# Patient Record
Sex: Female | Born: 1970 | Race: White | Hispanic: No | Marital: Married | Smoking: Former smoker
Health system: Southern US, Community
[De-identification: ages and names within clinical notes are randomized; demographics above are authoritative.]

## PROBLEM LIST (undated history)

## (undated) DIAGNOSIS — F329 Major depressive disorder, single episode, unspecified: Secondary | ICD-10-CM

## (undated) DIAGNOSIS — N63 Unspecified lump in unspecified breast: Secondary | ICD-10-CM

## (undated) DIAGNOSIS — E349 Endocrine disorder, unspecified: Secondary | ICD-10-CM

## (undated) DIAGNOSIS — R51 Headache: Secondary | ICD-10-CM

## (undated) DIAGNOSIS — Z87891 Personal history of nicotine dependence: Secondary | ICD-10-CM

## (undated) DIAGNOSIS — Z1239 Encounter for other screening for malignant neoplasm of breast: Secondary | ICD-10-CM

## (undated) DIAGNOSIS — Z1211 Encounter for screening for malignant neoplasm of colon: Secondary | ICD-10-CM

## (undated) DIAGNOSIS — F32A Depression, unspecified: Secondary | ICD-10-CM

## (undated) DIAGNOSIS — M549 Dorsalgia, unspecified: Secondary | ICD-10-CM

## (undated) DIAGNOSIS — E0590 Thyrotoxicosis, unspecified without thyrotoxic crisis or storm: Secondary | ICD-10-CM

## (undated) DIAGNOSIS — K5790 Diverticulosis of intestine, part unspecified, without perforation or abscess without bleeding: Secondary | ICD-10-CM

## (undated) DIAGNOSIS — M810 Age-related osteoporosis without current pathological fracture: Secondary | ICD-10-CM

## (undated) DIAGNOSIS — C801 Malignant (primary) neoplasm, unspecified: Secondary | ICD-10-CM

## (undated) DIAGNOSIS — E0500 Thyrotoxicosis with diffuse goiter without thyrotoxic crisis or storm: Secondary | ICD-10-CM

## (undated) DIAGNOSIS — J302 Other seasonal allergic rhinitis: Secondary | ICD-10-CM

## (undated) DIAGNOSIS — F419 Anxiety disorder, unspecified: Secondary | ICD-10-CM

## (undated) DIAGNOSIS — E785 Hyperlipidemia, unspecified: Secondary | ICD-10-CM

## (undated) DIAGNOSIS — D249 Benign neoplasm of unspecified breast: Secondary | ICD-10-CM

## (undated) DIAGNOSIS — M797 Fibromyalgia: Secondary | ICD-10-CM

## (undated) DIAGNOSIS — G8929 Other chronic pain: Secondary | ICD-10-CM

## (undated) HISTORY — DX: Encounter for other screening for malignant neoplasm of breast: Z12.39

## (undated) HISTORY — DX: Endocrine disorder, unspecified: E34.9

## (undated) HISTORY — DX: Other chronic pain: G89.29

## (undated) HISTORY — DX: Benign neoplasm of unspecified breast: D24.9

## (undated) HISTORY — DX: Personal history of nicotine dependence: Z87.891

## (undated) HISTORY — DX: Hyperlipidemia, unspecified: E78.5

## (undated) HISTORY — DX: Thyrotoxicosis with diffuse goiter without thyrotoxic crisis or storm: E05.00

## (undated) HISTORY — DX: Other seasonal allergic rhinitis: J30.2

## (undated) HISTORY — DX: Unspecified lump in unspecified breast: N63.0

## (undated) HISTORY — DX: Fibromyalgia: M79.7

## (undated) HISTORY — DX: Malignant (primary) neoplasm, unspecified: C80.1

## (undated) HISTORY — DX: Depression, unspecified: F32.A

## (undated) HISTORY — DX: Headache: R51

## (undated) HISTORY — DX: Thyrotoxicosis, unspecified without thyrotoxic crisis or storm: E05.90

## (undated) HISTORY — DX: Dorsalgia, unspecified: M54.9

## (undated) HISTORY — PX: COLONOSCOPY: SHX174

## (undated) HISTORY — DX: Age-related osteoporosis without current pathological fracture: M81.0

## (undated) HISTORY — DX: Diverticulosis of intestine, part unspecified, without perforation or abscess without bleeding: K57.90

## (undated) HISTORY — DX: Major depressive disorder, single episode, unspecified: F32.9

## (undated) HISTORY — PX: ABDOMINAL HYSTERECTOMY: SHX81

## (undated) HISTORY — DX: Anxiety disorder, unspecified: F41.9

## (undated) HISTORY — DX: Encounter for screening for malignant neoplasm of colon: Z12.11

---

## 1993-05-03 HISTORY — PX: TONSILLECTOMY: SUR1361

## 2006-05-03 HISTORY — PX: THYROID SURGERY: SHX805

## 2008-05-03 HISTORY — PX: TUBAL LIGATION: SHX77

## 2008-10-31 HISTORY — PX: ENDOMETRIAL ABLATION: SHX621

## 2009-02-26 ENCOUNTER — Ambulatory Visit: Payer: Self-pay | Admitting: Unknown Physician Specialty

## 2009-05-03 HISTORY — PX: BREAST BIOPSY: SHX20

## 2010-02-18 ENCOUNTER — Encounter: Payer: Self-pay | Admitting: Neurosurgery

## 2010-03-03 ENCOUNTER — Encounter: Payer: Self-pay | Admitting: Neurosurgery

## 2010-04-02 ENCOUNTER — Encounter: Payer: Self-pay | Admitting: Neurosurgery

## 2010-12-22 ENCOUNTER — Other Ambulatory Visit: Payer: Self-pay | Admitting: Gastroenterology

## 2011-01-05 ENCOUNTER — Emergency Department (HOSPITAL_COMMUNITY): Payer: Medicaid Other

## 2011-01-05 ENCOUNTER — Emergency Department (HOSPITAL_COMMUNITY)
Admission: EM | Admit: 2011-01-05 | Discharge: 2011-01-06 | Disposition: A | Discharge status: Home / Self Care | Payer: Medicaid Other | Attending: Emergency Medicine | Admitting: Emergency Medicine

## 2011-01-05 DIAGNOSIS — R059 Cough, unspecified: Secondary | ICD-10-CM | POA: Insufficient documentation

## 2011-01-05 DIAGNOSIS — R05 Cough: Secondary | ICD-10-CM | POA: Insufficient documentation

## 2011-01-05 DIAGNOSIS — R0602 Shortness of breath: Secondary | ICD-10-CM | POA: Insufficient documentation

## 2011-01-05 DIAGNOSIS — R0989 Other specified symptoms and signs involving the circulatory and respiratory systems: Secondary | ICD-10-CM | POA: Insufficient documentation

## 2011-01-05 DIAGNOSIS — R42 Dizziness and giddiness: Secondary | ICD-10-CM | POA: Insufficient documentation

## 2011-01-05 DIAGNOSIS — Z79899 Other long term (current) drug therapy: Secondary | ICD-10-CM | POA: Insufficient documentation

## 2011-01-05 DIAGNOSIS — R079 Chest pain, unspecified: Secondary | ICD-10-CM | POA: Insufficient documentation

## 2011-01-05 DIAGNOSIS — R109 Unspecified abdominal pain: Secondary | ICD-10-CM | POA: Insufficient documentation

## 2011-01-05 DIAGNOSIS — R0609 Other forms of dyspnea: Secondary | ICD-10-CM | POA: Insufficient documentation

## 2011-01-05 DIAGNOSIS — E039 Hypothyroidism, unspecified: Secondary | ICD-10-CM | POA: Insufficient documentation

## 2011-01-05 LAB — COMPREHENSIVE METABOLIC PANEL
AST: 18 U/L (ref 0–37)
Albumin: 4 g/dL (ref 3.5–5.2)
Alkaline Phosphatase: 60 U/L (ref 39–117)
BUN: 11 mg/dL (ref 6–23)
Potassium: 3.3 mEq/L — ABNORMAL LOW (ref 3.5–5.1)
Sodium: 138 mEq/L (ref 135–145)
Total Protein: 6.7 g/dL (ref 6.0–8.3)

## 2011-01-05 LAB — POCT I-STAT TROPONIN I
Troponin i, poc: 0 ng/mL (ref 0.00–0.08)
Troponin i, poc: 0.02 ng/mL (ref 0.00–0.08)

## 2011-01-05 LAB — DIFFERENTIAL
Basophils Absolute: 0 10*3/uL (ref 0.0–0.1)
Basophils Relative: 0 % (ref 0–1)
Eosinophils Absolute: 0.2 10*3/uL (ref 0.0–0.7)
Eosinophils Relative: 2 % (ref 0–5)
Monocytes Absolute: 0.8 10*3/uL (ref 0.1–1.0)

## 2011-01-05 LAB — CK TOTAL AND CKMB (NOT AT ARMC): Total CK: 50 U/L (ref 7–177)

## 2011-01-05 LAB — D-DIMER, QUANTITATIVE: D-Dimer, Quant: 0.24 ug/mL-FEU (ref 0.00–0.48)

## 2011-01-05 LAB — CBC
HCT: 38.8 % (ref 36.0–46.0)
MCHC: 34.8 g/dL (ref 30.0–36.0)
Platelets: 231 10*3/uL (ref 150–400)
RDW: 11.9 % (ref 11.5–15.5)
WBC: 9.9 10*3/uL (ref 4.0–10.5)

## 2011-01-14 ENCOUNTER — Ambulatory Visit: Payer: Self-pay | Admitting: Gastroenterology

## 2011-01-14 HISTORY — PX: ESOPHAGOGASTRODUODENOSCOPY: SHX1529

## 2011-03-08 ENCOUNTER — Other Ambulatory Visit (INDEPENDENT_AMBULATORY_CARE_PROVIDER_SITE_OTHER): Payer: Medicaid Other

## 2011-03-08 ENCOUNTER — Encounter: Payer: Self-pay | Admitting: Endocrinology

## 2011-03-08 ENCOUNTER — Ambulatory Visit (INDEPENDENT_AMBULATORY_CARE_PROVIDER_SITE_OTHER): Payer: Medicaid Other | Admitting: Endocrinology

## 2011-03-08 VITALS — BP 118/70 | HR 70 | Temp 98.3°F | Ht 67.0 in | Wt 162.0 lb

## 2011-03-08 DIAGNOSIS — E0590 Thyrotoxicosis, unspecified without thyrotoxic crisis or storm: Secondary | ICD-10-CM | POA: Insufficient documentation

## 2011-03-08 LAB — TSH: TSH: 3.07 u[IU]/mL (ref 0.35–5.50)

## 2011-03-08 NOTE — Progress Notes (Signed)
  Subjective:    Patient ID: Stephanie Good, female    DOB: January 20, 1971, 40 y.o.   MRN: [id number]  HPI Pt says she was Good dx'ed with hyperthyroidism in approx 2009.  She was rx'ed with tapazole x a few mos.  She stopped due to normalization of her tft.  She now reports few mos of severe tremor of the hands, and assoc hair loss.  6 weeks ago, tapazole was increased, and subsequent blood test showed normalization.  She has had tubal ligation.   No past medical history on file.  No past surgical history on file.  History   Social History  . Marital Status: Married    Spouse Name: N/A    Number of Children: N/A  . Years of Education: N/A   Occupational History  . Not on file.   Social History Main Topics  . Smoking status: Former Games developer  . Smokeless tobacco: Not on file  . Alcohol Use: Not on file  . Drug Use: Not on file  . Sexually Active: Not on file   Other Topics Concern  . Not on file   Social History Narrative  . No narrative on file    No current outpatient prescriptions on file prior to visit.    No Known Allergies  No family history on file. adopted BP 118/70  Pulse 70  Temp(Src) 98.3 F (36.8 C) (Oral)  Ht 5\' 7"  (1.702 m)  Wt 162 lb (73.483 kg)  BMI 25.37 kg/m2  SpO2 97%  Review of Systems denies diplopia, palpitations, sob, diarrhea, polyuria, seizure, and hypoglycemia.  She has lost 15 lbs x a few mos, and has since regained some of that.  She reports headache, itching, easy bruising, depression, rhinorrhea, anxiety, numbness of the left arm, excessive diaphoresis, hoarseness, and myalgias.    Objective:   Physical Exam VS: see vs page GEN: no distress HEAD: head: no deformity eyes: no periorbital swelling, no proptosis external nose and ears are normal mouth: no lesion seen NECK: supple, thyroid is borderline enlarged, diffuse.   CHEST WALL: no deformity LUNGS:  Clear to auscultation CV: reg rate and rhythm, no murmur ABD: abdomen is soft,  nontender.  no hepatosplenomegaly.  not distended.  no hernia.   MUSCULOSKELETAL: muscle bulk and strength are grossly normal.  no obvious joint swelling.  gait is normal and steady EXTEMITIES: no deformity.  no ulcer on the feet.  feet are of normal color and temp.  no edema PULSES: dorsalis pedis intact bilat.   NEURO:  cn 2-12 grossly intact.   readily moves all 4's.  sensation is intact to touch on the feet.  No tremor. SKIN:  Normal texture and temperature.  Not diaphoretic.  No rash or suspicious lesion is visible.   NODES:  None palpable at the neck. PSYCH: alert, oriented x3.  Does not appear anxious nor depressed.    outside test results are reviewed: TSH and free t4's are reviewed  Today: Lab Results  Component Value Date   TSH 3.07 03/08/2011      Assessment & Plan:  Hyperthyroidism, recurrent off tapazole, now well-controlled on back on it. Anxiety, not due to hyperthyroidism. Weight loss, may have been due to her recent hyperthyroidism.

## 2011-03-08 NOTE — Patient Instructions (Addendum)
if ever you have fever while taking methimazole, stop it and call us, because of the risk of a rare side-effect blood tests are being requested for you today.  please call [phone] to hear your test results.  You will be prompted to enter the 9-digit "MRN" number that appears at the top left of this page, followed by #.  Then you will hear the message. Please come back for a follow-up appointment for 1 month.  Please make an appointment. (update: i left message on phone-tree:  Reduce tapazole to 10-bid)

## 2011-03-10 ENCOUNTER — Encounter: Payer: Self-pay | Admitting: Endocrinology

## 2011-03-10 DIAGNOSIS — F32A Depression, unspecified: Secondary | ICD-10-CM | POA: Insufficient documentation

## 2011-03-10 DIAGNOSIS — F419 Anxiety disorder, unspecified: Secondary | ICD-10-CM | POA: Insufficient documentation

## 2011-03-10 DIAGNOSIS — F329 Major depressive disorder, single episode, unspecified: Secondary | ICD-10-CM | POA: Insufficient documentation

## 2011-03-10 DIAGNOSIS — E785 Hyperlipidemia, unspecified: Secondary | ICD-10-CM | POA: Insufficient documentation

## 2011-03-15 ENCOUNTER — Telehealth: Payer: Self-pay | Admitting: *Deleted

## 2011-03-15 NOTE — Telephone Encounter (Signed)
Pt informed of MD's advisement. 

## 2011-03-15 NOTE — Telephone Encounter (Signed)
Pt states that she was seen last week at [facility] and adjustment in medication was made. She states that she felt better a couple of days after taking medication at new dosage but now she feels worse and is still having flu-like sxs and fatigue. Pt is asking for MD's advisement.

## 2011-03-15 NOTE — Telephone Encounter (Signed)
As thyroid was normal, please see pcp about sxs

## 2011-04-05 ENCOUNTER — Encounter: Payer: Self-pay | Admitting: Endocrinology

## 2011-04-05 ENCOUNTER — Ambulatory Visit (INDEPENDENT_AMBULATORY_CARE_PROVIDER_SITE_OTHER): Payer: Medicaid Other | Admitting: Endocrinology

## 2011-04-05 ENCOUNTER — Other Ambulatory Visit (INDEPENDENT_AMBULATORY_CARE_PROVIDER_SITE_OTHER): Payer: Medicaid Other

## 2011-04-05 DIAGNOSIS — Z79899 Other long term (current) drug therapy: Secondary | ICD-10-CM | POA: Insufficient documentation

## 2011-04-05 DIAGNOSIS — E0590 Thyrotoxicosis, unspecified without thyrotoxic crisis or storm: Secondary | ICD-10-CM

## 2011-04-05 LAB — CBC WITH DIFFERENTIAL/PLATELET
Basophils Relative: 0.5 % (ref 0.0–3.0)
Eosinophils Relative: 1.4 % (ref 0.0–5.0)
HCT: 43.2 % (ref 36.0–46.0)
Lymphs Abs: 2.4 10*3/uL (ref 0.7–4.0)
MCV: 88.3 fl (ref 78.0–100.0)
Monocytes Absolute: 1 10*3/uL (ref 0.1–1.0)
Platelets: 223 10*3/uL (ref 150.0–400.0)
RBC: 4.89 Mil/uL (ref 3.87–5.11)
WBC: 10.2 10*3/uL (ref 4.5–10.5)

## 2011-04-05 NOTE — Progress Notes (Signed)
  Subjective:    Patient ID: Stephanie Good, female    DOB: [birth year], 40 y.o.   MRN: [id number]  HPI Pt returns for f/u of hyperthyroidism.  She says she saw pcp 1 week after ov here.  She says based on the result, tapazole was reduced to 10 mg daily.  She feels as though she has fever, but it is not detected on thermometer.  She reports sxs of nasal congestion, fatigue, and myalgias. Past Medical History  Diagnosis Date  . Headache   . Graves disease   . Hyperthyroidism   . Breast nodule     Fibrocystic nodule in the right breast  . Hemorrhoids   . Chronic back pain   . Diverticulosis   . Anxiety   . Depression   . Hyperlipidemia     Past Surgical History  Procedure Date  . Endometrial ablation July 2010  . Tonsillectomy 1995  . Tubal ligation 2010  . Breast biopsy 2011  . Esophagogastroduodenoscopy 01/14/2011    History   Social History  . Marital Status: Married    Spouse Name: N/A    Number of Children: N/A  . Years of Education: 132   Occupational History  . Hair Stylist    Social History Main Topics  . Smoking status: Former Games developer  . Smokeless tobacco: Not on file  . Alcohol Use: Yes  . Drug Use: No  . Sexually Active: Not on file   Other Topics Concern  . Not on file   Social History Narrative   Regular exercise-no    Current Outpatient Prescriptions on File Prior to Visit  Medication Sig Dispense Refill  . clonazePAM (KLONOPIN) 1 MG tablet Take 0.5 mg by mouth 2 (two) times daily. Or as needed for panic attacks       . methimazole (TAPAZOLE) 10 MG tablet Take 10 mg by mouth daily.         No Known Allergies  Family History  Problem Relation Age of Onset  . Adopted: Yes    BP 124/68  Pulse 72  Temp(Src) 97.9 F (36.6 C) (Oral)  Ht 5\' 6"  (1.676 m)  Wt 159 lb 12.8 oz (72.485 kg)  BMI 25.79 kg/m2  SpO2 97%  LMP 03/04/2011  Review of Systems Denies n/v.      Objective:   Physical Exam VITAL SIGNS:  See vs page GENERAL: no  distress Neck: thyroid is minimally and diffusely enlarged.  No nodule   Lab Results  Component Value Date   TSH 1.30 04/05/2011      Assessment & Plan:  Hyperthyroidism, well-controlled sxs of non-thyroidal etiology

## 2011-04-05 NOTE — Patient Instructions (Addendum)
if ever you have fever while taking methimazole, stop it and call us, because of the risk of a rare side-effect blood tests are being requested for you today.  please call [phone] to hear your test results.  You will be prompted to enter the 9-digit "MRN" number that appears at the top left of this page, followed by #.  Then you will hear the message. Please come back for a follow-up appointment for 6 weeks.  Please make an appointment. Please let me know if you want to pursue the radioactive iodine treatment.

## 2011-05-04 DIAGNOSIS — M549 Dorsalgia, unspecified: Secondary | ICD-10-CM

## 2011-05-04 DIAGNOSIS — M797 Fibromyalgia: Secondary | ICD-10-CM

## 2011-05-04 DIAGNOSIS — M810 Age-related osteoporosis without current pathological fracture: Secondary | ICD-10-CM

## 2011-05-04 HISTORY — DX: Age-related osteoporosis without current pathological fracture: M81.0

## 2011-05-04 HISTORY — DX: Dorsalgia, unspecified: M54.9

## 2011-05-04 HISTORY — DX: Fibromyalgia: M79.7

## 2011-05-17 ENCOUNTER — Ambulatory Visit: Payer: Medicaid Other | Admitting: Endocrinology

## 2011-08-23 DIAGNOSIS — E0500 Thyrotoxicosis with diffuse goiter without thyrotoxic crisis or storm: Secondary | ICD-10-CM | POA: Insufficient documentation

## 2011-08-27 DIAGNOSIS — E039 Hypothyroidism, unspecified: Secondary | ICD-10-CM | POA: Insufficient documentation

## 2011-10-11 DIAGNOSIS — R5381 Other malaise: Secondary | ICD-10-CM | POA: Insufficient documentation

## 2011-10-11 DIAGNOSIS — R29898 Other symptoms and signs involving the musculoskeletal system: Secondary | ICD-10-CM | POA: Insufficient documentation

## 2011-10-11 DIAGNOSIS — R5383 Other fatigue: Secondary | ICD-10-CM | POA: Insufficient documentation

## 2011-10-11 DIAGNOSIS — E559 Vitamin D deficiency, unspecified: Secondary | ICD-10-CM | POA: Insufficient documentation

## 2011-10-11 DIAGNOSIS — M5380 Other specified dorsopathies, site unspecified: Secondary | ICD-10-CM | POA: Insufficient documentation

## 2011-12-08 DIAGNOSIS — M5412 Radiculopathy, cervical region: Secondary | ICD-10-CM | POA: Insufficient documentation

## 2012-02-16 DIAGNOSIS — M6240 Contracture of muscle, unspecified site: Secondary | ICD-10-CM | POA: Insufficient documentation

## 2012-04-25 DIAGNOSIS — M797 Fibromyalgia: Secondary | ICD-10-CM | POA: Insufficient documentation

## 2012-04-25 DIAGNOSIS — M539 Dorsopathy, unspecified: Secondary | ICD-10-CM | POA: Insufficient documentation

## 2012-05-03 HISTORY — PX: BREAST LUMPECTOMY: SHX2

## 2012-06-24 ENCOUNTER — Encounter: Payer: Self-pay | Admitting: General Surgery

## 2012-06-26 ENCOUNTER — Encounter: Payer: Self-pay | Admitting: Endocrinology

## 2012-07-28 ENCOUNTER — Emergency Department: Payer: Self-pay | Admitting: Emergency Medicine

## 2012-07-28 LAB — URINALYSIS, COMPLETE
Bacteria: NONE SEEN
Bilirubin,UR: NEGATIVE
Ketone: NEGATIVE
Leukocyte Esterase: NEGATIVE
Nitrite: NEGATIVE
Ph: 5 (ref 4.5–8.0)
Protein: NEGATIVE
RBC,UR: 1 /HPF (ref 0–5)
WBC UR: 1 /HPF (ref 0–5)

## 2012-07-28 LAB — CBC
HCT: 37 % (ref 35.0–47.0)
HGB: 12.8 g/dL (ref 12.0–16.0)
MCHC: 34.7 g/dL (ref 32.0–36.0)
Platelet: 178 10*3/uL (ref 150–440)

## 2012-07-28 LAB — COMPREHENSIVE METABOLIC PANEL
Alkaline Phosphatase: 69 U/L (ref 50–136)
BUN: 14 mg/dL (ref 7–18)
Chloride: 107 mmol/L (ref 98–107)
EGFR (African American): >60
EGFR (Non-African Amer.): >60
Glucose: 101 mg/dL — ABNORMAL HIGH (ref 65–99)
Osmolality: 274 (ref 275–301)
Potassium: 3.6 mmol/L (ref 3.5–5.1)
SGPT (ALT): 36 U/L (ref 12–78)
Sodium: 137 mmol/L (ref 136–145)

## 2012-08-17 ENCOUNTER — Encounter: Payer: Self-pay | Admitting: General Surgery

## 2012-08-18 NOTE — Progress Notes (Signed)
Quick Note:  Make sure the additional views are done prior to her office visit ______ 

## 2012-08-21 ENCOUNTER — Encounter: Payer: Self-pay | Admitting: *Deleted

## 2012-08-21 ENCOUNTER — Encounter: Payer: Self-pay | Admitting: General Surgery

## 2012-08-25 NOTE — Progress Notes (Signed)
Patient had additional views completed at [facility] on 08-18-12.

## 2012-08-28 ENCOUNTER — Encounter: Payer: Self-pay | Admitting: General Surgery

## 2012-08-28 ENCOUNTER — Ambulatory Visit (INDEPENDENT_AMBULATORY_CARE_PROVIDER_SITE_OTHER): Payer: BC Managed Care – PPO | Admitting: General Surgery

## 2012-08-28 VITALS — BP 110/80 | HR 68 | Resp 12 | Ht 67.0 in | Wt 167.0 lb

## 2012-08-28 DIAGNOSIS — R920 Mammographic microcalcification found on diagnostic imaging of breast: Secondary | ICD-10-CM

## 2012-08-28 NOTE — Progress Notes (Signed)
Patient ID: Stephanie Good, female   DOB: 06-Nov-1970, 42 y.o.   MRN: [id number]  Chief Complaint  Patient presents with  . Follow-up    mammogram    HPI Stephanie Good is a 42 y.o. female who presents for a follow up mammogram. The most recent mammogram was done on 08/18/12 with a birad category 4. The patient states the right breast occasionally has nipple discharge. She states it's not a lot but that she seems to notice it when she gets out of the shower. She also complains of right axillary pain. She notices that when she drinks caffeine the tenderness is worse. She has discussed this issue in the past. No other breast problems at this time. She states she does not check her breast regularly but does get regular mammograms.  HPI  Past Medical History  Diagnosis Date  . Headache   . Graves disease   . Hyperthyroidism   . Breast nodule     Fibrocystic nodule in the right breast  . Hemorrhoids   . Chronic back pain   . Diverticulosis   . Anxiety   . Hyperlipidemia   . Seasonal allergies     eye itching and swelling  . Depression   . Benign neoplasm of breast   . Personal history of tobacco use, presenting hazards to health   . Endocrine problem     thyroid goiter  . Breast screening, unspecified   . Lump or mass in breast   . Special screening for malignant neoplasms, colon   . Back pain 2013  . Fibromyalgia 2013  . Osteoporosis 2013    Past Surgical History  Procedure Laterality Date  . Endometrial ablation  July 2010  . Tubal ligation  2010  . Breast biopsy  2011  . Esophagogastroduodenoscopy  01/14/2011  . Colonoscopy  1995 and 2012  . Thyroid surgery  2008  . Tonsillectomy  1995    Family History  Problem Relation Age of Onset  . Adopted: Yes    Social History History  Substance Use Topics  . Smoking status: Former Smoker -- 0.50 packs/day for 20 years    Types: Cigarettes  . Smokeless tobacco: Not on file  . Alcohol Use: 1.5 - 2.5 oz/week    Allergies   Allergen Reactions  . Pollen Extract Itching    Eye swelling    Current Outpatient Prescriptions  Medication Sig Dispense Refill  . clonazePAM (KLONOPIN) 1 MG tablet Take 0.5 mg by mouth 2 (two) times daily. Or as needed for panic attacks       . DULoxetine (CYMBALTA) 60 MG capsule Take 60 mg by mouth daily.      Marland Kitchen HYDROcodone-acetaminophen (NORCO/VICODIN) 5-325 MG per tablet Take 1 tablet by mouth at bedtime as needed for pain.      Marland Kitchen levothyroxine (SYNTHROID) 25 MCG tablet Take 25 mcg by mouth daily.      . methocarbamol (ROBAXIN) 500 MG tablet Take 500 mg by mouth 4 (four) times daily.      . traMADol (ULTRAM) 50 MG tablet Take 50 mg by mouth daily.       No current facility-administered medications for this visit.    Review of Systems Review of Systems  Constitutional: Negative.   Respiratory: Negative.   Cardiovascular: Negative.     Blood pressure 110/80, pulse 68, resp. rate 12, height 5\' 7"  (1.702 m), weight 167 lb (75.751 kg), last menstrual period 08/18/2012.  Physical Exam Physical Exam  Constitutional: She appears well-developed  and well-nourished.  Eyes: Conjunctivae are normal. No scleral icterus.  Neck: Trachea normal. No mass and no thyromegaly present.  Cardiovascular: Normal rate, regular rhythm, normal heart sounds and normal pulses.   No murmur heard. Pulmonary/Chest: Effort normal and breath sounds normal. Right breast exhibits no inverted nipple, no mass, no nipple discharge, no skin change and no tenderness. Left breast exhibits no inverted nipple, no mass, no nipple discharge, no skin change and no tenderness. Breasts are symmetrical.  Lymphadenopathy:    She has no cervical adenopathy.    She has no axillary adenopathy.    Data Reviewed Mammogram reviewed. Focal cluster of microcalcification of right breast in LOQ.  Assessment    Abnormal Mammogram.     Plan    Stereotactic Biopsy.      Patient to have a right breast stereotactic biopsy  at [facility] for 09-11-12 at 1 pm. She is aware of date, time, and instructions.   Stephanie Good 08/28/2012, 7:32 PM

## 2012-08-28 NOTE — Patient Instructions (Addendum)
Patient advised to have stereotactic breast biopsy done on the right breast.    Breast Biopsy, Stereotactic A stereotactic breast biopsy takes a tissue sample from the breast with a special instrument. This is done when:  The problem (lump, abnormality, mass) can be seen on X-ray, but not felt on physical exam.  Suspicious, small calcium deposits (calcifications) are seen in the breast.  There is a change in shape or appearance of the breast, thickening, or asymmetry on mammogram (breast X-ray).  You have nipple changes (unusual or bloody discharge, crusting, retraction, dimpling).  Your caregiver is making a surgical diagnosis. The biopsy may be done on a special table, with your face down and your breasts placed through openings in the table. Computerized imaging (special form of X-rays) is used. The images are not obtained using regular X-ray film. So, exposure to radiation is reduced. Images are seen through several different angles. The surgeon removes small pieces of the suspicious tissue through a hollow needle. The tissue will be sent to the lab for analysis. The surgeon can look at the pictures right away, rather than wait for an X-ray to be developed. Your caregiver can mark the lesions (abnormal tissue formations) electronically. Then the computer can tell exactly where the problem is, or if it has moved. BENEFITS OF THE PROCEDURE  This is a good way to see if tiny lumps, abnormal looking tissue, or calcium deposits that you cannot feel are cancerous or require further treatment or follow-up.  Needle biopsy is a simple procedure. It may be performed in an outpatient imaging center. This means you have the procedure and go home the same day, without checking into a hospital.  It is less painful than open surgery. The results are as accurate as when a tissue sample is removed surgically.  The procedure is faster, less expensive, less invasive, does not distort the breast, and leaves  little or no scar.  Breast defects, which can make future mammograms hard to read and interpret, do not remain.  Recovery time is brief. Patients can soon resume their normal activities.  Using VAD (vacuum assisted device) may make it possible to remove entire lesions.  A breast biopsy can indicate if you need surgery, other treatment, or combined treatment. LET YOUR CAREGIVER KNOW ABOUT:  Allergies.  Medications taken, including herbs, eye drops, over-the-counter medications, and creams.  Use of steroids (by mouth or creams).  Previous problems with anesthetics or numbing medication.  If you are taking blood thinner medications or aspirin.  Possibility of pregnancy, if this applies.  History of blood clots (thrombophlebitis).  History of bleeding or blood problems.  Previous surgery.  Other health problems. RISKS AND COMPLICATIONS  Infection (germ growing in the wound). This can often be treated with antibiotics.  Bleeding, following surgery. Your surgeon takes every precaution to keep this from happening.  There is some concern that if a cancerous mass is present, cancer cells might be spread by the needle. Whether this actually happens is not known. It does not appear to be a significant risk.  X-ray guided breast biopsy is not infallible (not always correct). The problem may be missed or the extent of the problem may be underestimated. This would mean the biopsy did not manage to remove a piece of the diseased tissue or enough of the diseased tissue.  Lesions present, with calcium deposits scattered throughout the breast, are difficult to target by stereotactic method. Those lesions near the chest wall also are hard to learn  about by this method. If the mammogram shows only a vague change in tissue density, but no definite mass or nodule, the X-ray guided method may not be successful. Occasionally, even after a successful biopsy, the tissue diagnosis remains uncertain. A  surgical biopsy will be needed, if abnormal or precancerous cells are found on core biopsy.  Altering or deforming of the breast.  Unable to find, or missing the lesion.  Rarely, the needle may go through the chest wall into the lung area. TWO BIOPSY INSTRUMENTS MAY BE USED IN THE PROCEDURE The conventional biopsy device (core needle biopsy device) consists of an inner needle with a trough extending from it at one end, and an overlying sheath. It is attached to a spring-loaded mechanism that propels it forward. The trough fills with tissue. The outer sheath instantly moves forward to cut the tissue and keep it in the trough. Each sample is obtained in a fraction of a second. It is necessary to withdraw the needle after each sample is taken to collect the tissue.  A newer type of instrument, the VAD (vacuum assisted device), uses vacuum pressure to pull breast tissue into a needle and remove it. The needle does not need to be withdrawn after each sampling. Another advantage is that biopsies are obtained in an orderly manner, by rotating the device. This helps make sure that the entire area of interest will be sampled. When using the automated core biopsy needle, sampling is more random.  FOR COMFORT DURING THE TEST  Relax as much as possible.  Try to follow instructions, to speed up the test.  Let your caregiver know if you are uncomfortable, anxious, or in pain. PROCEDURE  You are awake during the procedure, and you go home the same day (outpatient). A specially trained radiologist will do this procedure. First, the skin is cleansed. Then, it is injected with a local anesthetic. A small nick is made in the skin, and the tip of the biopsy needle is put into the calculated site of the lesion. A special mammography machine uses ionizing radiation to help guide the radiologist's instrument to the site of the abnormal growth. At this point, stereo images are again obtained, to confirm that the needle  tip is at the problem area. Usually 5 to 10 samples are collected when doing a core biopsy. At least 12 are collected when using the vacuum assisted device (VAD). Then, a final set of images is obtained. If they show that the lesion has been mostly or completely removed, a small clip is left at the biopsy site. This is so that it can be easily located, in case the lesion turns out to be cancer. Afterward, the skin opening is stitched (sutured) or taped closed, and covered with a dressing. Your caregiver may apply a pressure dressing and an ice pack, to prevent bleeding and swelling in the breast.  X-ray guided breast biopsy can take 30 minutes to 1 hour, or more. The X-rays usually have no side effects, and no radiation remains in your body. There is usually little or no pain. Usually no scar is left from the tiny skin incision. Many women find that the major discomfort of the procedure is from lying on their stomach, or staying in 1 position for the length of the procedure. This discomfort may be reduced by carefully placed cushions. You should wear a good support bra to the procedure. You will be asked to remove jewelry, dentures, eye glasses, metal objects, or clothing that  might interfere with the X-ray images. You may want to have someone with you, to take you home after the procedure. AFTER THE PROCEDURE   After surgery, if you are doing well and have no problems, you will be allowed to go home.  You may resume your regular diet, or as directed by your caregiver. HOME CARE INSTRUCTIONS   Follow your caregiver's recommendations for medications, care of the biopsy site, follow-up appointments, and further treatment.  Only take over-the-counter or prescription medicines for pain, discomfort, or fever as directed by your caregiver.  An ice pack applied to the affected area may help with discomfort and keep the swelling down.  Change dressings as directed.  Wear a good support bra for as long as  your caregiver recommends.  Avoid strenuous activity for at least 24 hours, or as advised by your caregiver. Finding out the results of your test Not all test results are available during your visit. If your test results are not back during the visit, make an appointment with your caregiver to find out the results. Do not assume everything is normal if you have not heard from your caregiver or the medical facility. It is important for you to follow up on all of your test results.  SEEK MEDICAL CARE IF:   You develop a rash.  You have problems with your medicines.  You become lightheaded or dizzy. SEEK IMMEDIATE MEDICAL CARE IF:   There is increased bleeding (more than a small spot) from the biopsy site.  You notice redness, swelling, or increasing pain in the wound.  Pus is coming from the wound.  You have a fever.  You notice a bad smell coming from the wound or dressing.  You develop shortness of breath.  You develop chest pain.  You pass out. Document Released: 01/16/2003 Document Revised: 07/12/2011 Document Reviewed: 02/21/2009 [facility] Patient Information 2013 [place], [place].  Patient to have a right breast stereotactic biopsy at [facility] for 09-11-12 at 1 pm. She is aware of date, time, and instructions.

## 2012-08-31 HISTORY — PX: BREAST BIOPSY: SHX20

## 2012-09-11 ENCOUNTER — Ambulatory Visit: Payer: Self-pay | Admitting: General Surgery

## 2012-09-11 DIAGNOSIS — R920 Mammographic microcalcification found on diagnostic imaging of breast: Secondary | ICD-10-CM

## 2012-09-13 ENCOUNTER — Ambulatory Visit (INDEPENDENT_AMBULATORY_CARE_PROVIDER_SITE_OTHER): Payer: BC Managed Care – PPO | Admitting: General Surgery

## 2012-09-13 ENCOUNTER — Encounter: Payer: Self-pay | Admitting: General Surgery

## 2012-09-13 VITALS — BP 100/62 | HR 70 | Resp 14 | Ht 67.0 in | Wt 165.0 lb

## 2012-09-13 DIAGNOSIS — D0511 Intraductal carcinoma in situ of right breast: Secondary | ICD-10-CM

## 2012-09-13 DIAGNOSIS — D0590 Unspecified type of carcinoma in situ of unspecified breast: Secondary | ICD-10-CM

## 2012-09-13 NOTE — Progress Notes (Signed)
Patient ID: Stephanie Good, female   DOB: [birth year], 42 y.o.   MRN: [id number]  Chief Complaint  Patient presents with  . Follow-up    discussion    HPI Stephanie Good is a 42 y.o. female. Patient here today for discussion of breast biopsy results. HPI  Past Medical History  Diagnosis Date  . Headache   . Graves disease   . Hyperthyroidism   . Breast nodule     Fibrocystic nodule in the right breast  . Hemorrhoids   . Chronic back pain   . Diverticulosis   . Anxiety   . Hyperlipidemia   . Seasonal allergies     eye itching and swelling  . Depression   . Benign neoplasm of breast   . Personal history of tobacco use, presenting hazards to health   . Endocrine problem     thyroid goiter  . Breast screening, unspecified   . Lump or mass in breast   . Special screening for malignant neoplasms, colon   . Back pain 2013  . Fibromyalgia 2013  . Osteoporosis 2013    Past Surgical History  Procedure Laterality Date  . Endometrial ablation  July 2010  . Tubal ligation  2010  . Esophagogastroduodenoscopy  01/14/2011  . Colonoscopy  1995 and 2012  . Thyroid surgery  2008  . Tonsillectomy  1995  . Breast biopsy  2011  . Breast biopsy Right May 2014    Family History  Problem Relation Age of Onset  . Adopted: Yes    Social History History  Substance Use Topics  . Smoking status: Former Smoker -- 0.50 packs/day for 20 years    Types: Cigarettes  . Smokeless tobacco: Not on file  . Alcohol Use: 1.5 - 2.5 oz/week    Allergies  Allergen Reactions  . Pollen Extract Itching    Eye swelling    Current Outpatient Prescriptions  Medication Sig Dispense Refill  . clonazePAM (KLONOPIN) 1 MG tablet Take 0.5 mg by mouth 2 (two) times daily. Or as needed for panic attacks       . DULoxetine (CYMBALTA) 60 MG capsule Take 60 mg by mouth daily.      Marland Kitchen HYDROcodone-acetaminophen (NORCO/VICODIN) 5-325 MG per tablet Take 1 tablet by mouth at bedtime as needed for pain.      Marland Kitchen  levothyroxine (SYNTHROID) 25 MCG tablet Take 25 mcg by mouth daily.      . methocarbamol (ROBAXIN) 500 MG tablet Take 500 mg by mouth 4 (four) times daily.      . traMADol (ULTRAM) 50 MG tablet Take 50 mg by mouth daily.       No current facility-administered medications for this visit.    Review of Systems Review of Systems  Constitutional: Negative.   Respiratory: Positive for cough.   Cardiovascular: Negative.     Blood pressure 100/62, pulse 70, resp. rate 14, height 5\' 7"  (1.702 m), weight 165 lb (74.844 kg), last menstrual period 09/12/2012.  Physical Exam Physical Exam Steri strips intact, no bruising noted.   Data Reviewed Pathoilogy from right breast stereobiopsy-low gade DCIS. ER/PR pending  Assessment    DCIS right breast     Plan    Discussed findings in full with pt. Treatment options were reviewed. Pt is amenable to lumpectomy.  Procedure explained in full to pt.     Patient's surgery has been scheduled for 09-22-12 at [facility].    Aamari Strawderman G 09/14/2012, 7:46 PM

## 2012-09-13 NOTE — Patient Instructions (Addendum)
Proceed with lumpectomy right breast. Call office for any new  issues or concerns.  Patient's surgery has been scheduled for 09-22-12 at [facility].

## 2012-09-14 ENCOUNTER — Encounter: Payer: Self-pay | Admitting: General Surgery

## 2012-09-14 ENCOUNTER — Telehealth: Payer: Self-pay | Admitting: *Deleted

## 2012-09-14 ENCOUNTER — Ambulatory Visit: Payer: BC Managed Care – PPO | Admitting: General Surgery

## 2012-09-14 NOTE — Telephone Encounter (Signed)
Patient would like a phone call from Dr Evette Cristal once the additional test results come in because she wants to talk about having a mastectomy.

## 2012-09-15 ENCOUNTER — Other Ambulatory Visit: Payer: Self-pay | Admitting: General Surgery

## 2012-09-15 DIAGNOSIS — D0511 Intraductal carcinoma in situ of right breast: Secondary | ICD-10-CM

## 2012-09-18 ENCOUNTER — Telehealth: Payer: Self-pay | Admitting: *Deleted

## 2012-09-18 NOTE — Telephone Encounter (Signed)
Dr. Evette Cristal has contacted patient and she wishes to cancel surgery for 09-22-12 at this time. She is to come to the office on 09-19-12 to have genetic testing done. Further decision for surgery will be based on results.   Leah in [facility] notified of cancellation.

## 2012-09-19 ENCOUNTER — Ambulatory Visit: Payer: BC Managed Care – PPO

## 2012-09-19 ENCOUNTER — Ambulatory Visit (INDEPENDENT_AMBULATORY_CARE_PROVIDER_SITE_OTHER): Payer: BC Managed Care – PPO | Admitting: *Deleted

## 2012-09-19 DIAGNOSIS — D0590 Unspecified type of carcinoma in situ of unspecified breast: Secondary | ICD-10-CM

## 2012-09-19 DIAGNOSIS — D0511 Intraductal carcinoma in situ of right breast: Secondary | ICD-10-CM

## 2012-09-19 NOTE — Patient Instructions (Signed)
Continue self breast exams. Call office for any new breast issues or concerns. 

## 2012-09-19 NOTE — Progress Notes (Signed)
Patient here today for genetic testing. Completed.

## 2012-10-06 ENCOUNTER — Ambulatory Visit (INDEPENDENT_AMBULATORY_CARE_PROVIDER_SITE_OTHER): Payer: BC Managed Care – PPO | Admitting: General Surgery

## 2012-10-06 ENCOUNTER — Encounter: Payer: Self-pay | Admitting: General Surgery

## 2012-10-06 VITALS — BP 120/68 | HR 76 | Resp 12 | Ht 67.0 in | Wt 161.0 lb

## 2012-10-06 DIAGNOSIS — D0511 Intraductal carcinoma in situ of right breast: Secondary | ICD-10-CM

## 2012-10-06 DIAGNOSIS — D0590 Unspecified type of carcinoma in situ of unspecified breast: Secondary | ICD-10-CM

## 2012-10-06 NOTE — Progress Notes (Signed)
Patient ID: Stephanie Good, female   DOB: [birth year], 42 y.o.   MRN: [id number]  Ms Peoples underwent genetic testing-negative for,BRCA1 and 2. 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 Standard of care-lumpectomy followed by radiation and subsequent antihormonal therapy explained in full. Surgery procedure, risks and benefits explained.  Pt is agreeable to proceed with lumpectomy.

## 2012-10-06 NOTE — Addendum Note (Signed)
Addended by: Kieth Brightly on: 10/06/2012 02:46 PM   Modules accepted: Orders

## 2012-10-06 NOTE — Patient Instructions (Signed)
Follow up after lumpectomy

## 2012-10-13 ENCOUNTER — Ambulatory Visit: Payer: Self-pay | Admitting: General Surgery

## 2012-10-13 DIAGNOSIS — D0590 Unspecified type of carcinoma in situ of unspecified breast: Secondary | ICD-10-CM

## 2012-10-16 ENCOUNTER — Encounter: Payer: Self-pay | Admitting: General Surgery

## 2012-10-17 ENCOUNTER — Encounter: Payer: Self-pay | Admitting: General Surgery

## 2012-10-18 LAB — PATHOLOGY REPORT

## 2012-10-19 ENCOUNTER — Ambulatory Visit (INDEPENDENT_AMBULATORY_CARE_PROVIDER_SITE_OTHER): Payer: BC Managed Care – PPO | Admitting: General Surgery

## 2012-10-19 ENCOUNTER — Encounter: Payer: Self-pay | Admitting: General Surgery

## 2012-10-19 VITALS — BP 128/72 | HR 74 | Resp 12 | Ht 67.0 in | Wt 167.0 lb

## 2012-10-19 DIAGNOSIS — D0590 Unspecified type of carcinoma in situ of unspecified breast: Secondary | ICD-10-CM

## 2012-10-19 DIAGNOSIS — D0511 Intraductal carcinoma in situ of right breast: Secondary | ICD-10-CM

## 2012-10-19 NOTE — Progress Notes (Signed)
Patient ID: Stephanie Good, female   DOB: 13-Feb-1971, 42 y.o.   MRN: [id number]  The patient is here today for post op right breast lumpectomy.   Right breast incision looks clean, mild ecchymosis. No signs of infection.   Path report showed margins are clear. Final diagnosis is DCIS low grade ER/PR positive.  Will have radiation oncology consult for mammosite.   Patient to talk with Dr. Rushie Chestnut in regards to the benefits of a mammosite placement. Due to the late hour, patient will be contacted in the morning once appointment date has been arranged.

## 2012-10-19 NOTE — Patient Instructions (Addendum)
Patient to talk with Dr. Rushie Chestnut in regards to the benefits of a mammosite placement.

## 2012-10-20 ENCOUNTER — Telehealth: Payer: Self-pay | Admitting: *Deleted

## 2012-10-20 NOTE — Telephone Encounter (Signed)
Patient has been scheduled with Dr. Rushie Chestnut at the [facility] for Monday, 10-23-12 at 11 am, for evaluation of possible mammosite. Message has been left on patient's cell phone with details. I have asked that she call the office back to confirm that she did receive message.

## 2012-10-23 ENCOUNTER — Ambulatory Visit: Payer: Self-pay | Admitting: Radiation Oncology

## 2012-10-24 ENCOUNTER — Telehealth: Payer: Self-pay | Admitting: *Deleted

## 2012-10-24 DIAGNOSIS — N63 Unspecified lump in unspecified breast: Secondary | ICD-10-CM

## 2012-10-24 MED ORDER — CEFADROXIL 500 MG PO CAPS: 500.0000 mg | ORAL_CAPSULE | Freq: Two times a day (BID) | ORAL | Status: DC

## 2012-10-24 NOTE — Telephone Encounter (Signed)
Mammosite schedule reviewed with the patient Placement  July 1 at 8:30     at [facility] Scan July 2 at 10:00 Treat July 3,7,8,9,10 Aware [name] will be calling her for more details Aware of ATB and directions reviewed. Aware no showers and to wear her bra while mammosite in place. Pt agrees.

## 2012-10-24 NOTE — Telephone Encounter (Signed)
Rx for ATB sent

## 2012-10-31 ENCOUNTER — Ambulatory Visit (INDEPENDENT_AMBULATORY_CARE_PROVIDER_SITE_OTHER): Payer: BC Managed Care – PPO | Admitting: General Surgery

## 2012-10-31 ENCOUNTER — Encounter: Payer: Self-pay | Admitting: General Surgery

## 2012-10-31 ENCOUNTER — Ambulatory Visit: Payer: Self-pay | Admitting: Radiation Oncology

## 2012-10-31 VITALS — BP 100/64 | HR 74 | Resp 12 | Ht 67.0 in | Wt 170.0 lb

## 2012-10-31 DIAGNOSIS — D0511 Intraductal carcinoma in situ of right breast: Secondary | ICD-10-CM

## 2012-10-31 DIAGNOSIS — D0590 Unspecified type of carcinoma in situ of unspecified breast: Secondary | ICD-10-CM

## 2012-10-31 MED ORDER — FLUCONAZOLE 150 MG PO TABS: 150.0000 mg | ORAL_TABLET | Freq: Once | ORAL | Status: DC

## 2012-10-31 NOTE — Patient Instructions (Addendum)
Patient care kit given to patient.  Instructed no showers, sponge bath while mammosite in place, take antibiotic. Follow up with Cancer Center as arranged. Discussed wearing your bra for support at all times.  Follow up at cancer center as scheduled

## 2012-10-31 NOTE — Progress Notes (Signed)
Patient here for right breast mammosite placement. Procedure completed today.  EXCISIONAL BREAST BIOPSY REPORT  Name:  Stephanie Good DOB:  Mar 27, 1971  Vital signs:BP 100/64  Pulse 74  Resp 12  Ht 5\' 7"  (1.702 m)  Wt 170 lb (77.111 kg)  BMI 26.62 kg/m2  LMP 10/13/2012  The patient has been found to be an acceptable candidate for MammoSite radiation treatment for her recently diagnosed breast cancer of the  Right    breast after consultation with Carmina Miller, M.D. from radiation oncology.  The patient returns today for planned insertion of a treatment balloon.  Pre-procedure ultrasound has shown an adequate buffer between the skin and the underlying cavity from her wide excision.  A total of 8cc of 1% Xylocaine with 0.5% Marcaine was used for local anesthesia and was well tolerated.  The breast was then prepped with chloroprep and draped. Under ultrasound guidance the 5-6 centimeter  Spherical MammoSite balloon was inserted through the end of previous incision. This was inflated to a volume of 40cc with normal saline mixed with Omnipaque.  The procedure was well tolerated.  Scant bleeding was noted.  Antibiotic cream was placed at the insertion site and a gauze pad applied.  She has an appointment with radiation oncology staff for a CT scan of the breast to confirm balloon size and skin margins in the near future. Lowest skin margin on [place] is 0.7cm  CC:  BRONSTEIN,DAVID, MD CC: Carmina Miller, M.D.  SANKAR,SEEPLAPUTHUR G

## 2012-11-09 ENCOUNTER — Ambulatory Visit (INDEPENDENT_AMBULATORY_CARE_PROVIDER_SITE_OTHER): Payer: BC Managed Care – PPO | Admitting: *Deleted

## 2012-11-09 DIAGNOSIS — D0511 Intraductal carcinoma in situ of right breast: Secondary | ICD-10-CM

## 2012-11-09 DIAGNOSIS — D0590 Unspecified type of carcinoma in situ of unspecified breast: Secondary | ICD-10-CM

## 2012-11-09 NOTE — Progress Notes (Signed)
Steri strip and gauze with Tegaderm applied.

## 2012-11-09 NOTE — Patient Instructions (Addendum)
Dressing care as discussed Use Ice pack today

## 2012-11-20 ENCOUNTER — Ambulatory Visit (INDEPENDENT_AMBULATORY_CARE_PROVIDER_SITE_OTHER): Payer: BC Managed Care – PPO | Admitting: General Surgery

## 2012-11-20 ENCOUNTER — Encounter: Payer: Self-pay | Admitting: General Surgery

## 2012-11-20 VITALS — BP 120/70 | HR 80 | Resp 16 | Ht 67.0 in | Wt 171.0 lb

## 2012-11-20 DIAGNOSIS — D0590 Unspecified type of carcinoma in situ of unspecified breast: Secondary | ICD-10-CM

## 2012-11-20 DIAGNOSIS — D0511 Intraductal carcinoma in situ of right breast: Secondary | ICD-10-CM

## 2012-11-20 DIAGNOSIS — D0510 Intraductal carcinoma in situ of unspecified breast: Secondary | ICD-10-CM | POA: Insufficient documentation

## 2012-11-20 MED ORDER — TAMOXIFEN CITRATE 20 MG PO TABS: 20.0000 mg | ORAL_TABLET | Freq: Every day | ORAL | Status: AC

## 2012-11-20 NOTE — Progress Notes (Signed)
This is an 43 year old female here today for an post op mammosite placement.  Has completed radiation without problems.  Right breast lumpectomy site looks clean. No signs of infection.

## 2012-11-20 NOTE — Patient Instructions (Addendum)
Patient to start Tamoxifen 20 mg by mouth daily. Patient to return in 1 month.

## 2012-11-21 ENCOUNTER — Encounter: Payer: Self-pay | Admitting: General Surgery

## 2012-11-28 ENCOUNTER — Encounter: Payer: Self-pay | Admitting: General Surgery

## 2012-12-01 ENCOUNTER — Ambulatory Visit: Payer: Self-pay | Admitting: Radiation Oncology

## 2012-12-18 ENCOUNTER — Telehealth: Payer: Self-pay | Admitting: *Deleted

## 2012-12-18 NOTE — Telephone Encounter (Signed)
Patient called the office to see if we have any samples of Tamoxifen. I have checked with Dr. Evette Cristal and we do not. He suggested that she call Dr. Kipp Laurence office. Patient verbalizes understanding.

## 2012-12-20 ENCOUNTER — Ambulatory Visit: Payer: BC Managed Care – PPO | Admitting: General Surgery

## 2013-01-04 ENCOUNTER — Ambulatory Visit (INDEPENDENT_AMBULATORY_CARE_PROVIDER_SITE_OTHER): Payer: BC Managed Care – PPO | Admitting: General Surgery

## 2013-01-04 ENCOUNTER — Encounter: Payer: Self-pay | Admitting: General Surgery

## 2013-01-04 VITALS — BP 118/82 | HR 72 | Resp 12 | Ht 67.0 in | Wt 170.0 lb

## 2013-01-04 DIAGNOSIS — D0511 Intraductal carcinoma in situ of right breast: Secondary | ICD-10-CM

## 2013-01-04 DIAGNOSIS — D0590 Unspecified type of carcinoma in situ of unspecified breast: Secondary | ICD-10-CM

## 2013-01-04 NOTE — Patient Instructions (Addendum)
Patient to return in two months right breast mammogram.

## 2013-01-04 NOTE — Progress Notes (Signed)
Patient ID: Stephanie Good, female   DOB: 1970/12/09, 42 y.o.   MRN: [id number]  Chief Complaint  Patient presents with  . Other    breast cancer    HPI Stephanie Good is a 42 y.o. female here today following up from right breast cancer. 2 months post lumpectomy,mammosite radiation. Currently on Tamoxifen. Has some hot flashes but is doing ok with it.  HPI  Past Medical History  Diagnosis Date  . Headache(784.0)   . Graves disease   . Hyperthyroidism   . Breast nodule     Fibrocystic nodule in the right breast  . Hemorrhoids   . Chronic back pain   . Diverticulosis   . Anxiety   . Hyperlipidemia   . Seasonal allergies     eye itching and swelling  . Depression   . Benign neoplasm of breast   . Personal history of tobacco use, presenting hazards to health   . Endocrine problem     thyroid goiter  . Breast screening, unspecified   . Lump or mass in breast   . Special screening for malignant neoplasms, colon   . Back pain 2013  . Fibromyalgia 2013  . Osteoporosis 2013    Past Surgical History  Procedure Laterality Date  . Endometrial ablation  July 2010  . Tubal ligation  2010  . Esophagogastroduodenoscopy  01/14/2011  . Colonoscopy  1995 and 2012  . Thyroid surgery  2008  . Tonsillectomy  1995  . Breast biopsy  2011  . Breast biopsy Right May 2014  . Breast lumpectomy Right 2014    DCIS    Family History  Problem Relation Age of Onset  . Adopted: Yes    Social History History  Substance Use Topics  . Smoking status: Former Smoker -- 0.50 packs/day for 20 years    Types: Cigarettes  . Smokeless tobacco: Never Used  . Alcohol Use: 1.5 - 2.5 oz/week    Allergies  Allergen Reactions  . Pollen Extract Itching    Eye swelling    Current Outpatient Prescriptions  Medication Sig Dispense Refill  . cefadroxil (DURICEF) 500 MG capsule Take 1 capsule (500 mg total) by mouth 2 (two) times daily.  20 capsule  0  . clonazePAM (KLONOPIN) 1 MG tablet Take 0.5 mg  by mouth 2 (two) times daily. Or as needed for panic attacks       . DULoxetine (CYMBALTA) 60 MG capsule Take 60 mg by mouth daily.      . fluconazole (DIFLUCAN) 150 MG tablet Take 1 tablet (150 mg total) by mouth once.  1 tablet  0  . HYDROcodone-acetaminophen (NORCO/VICODIN) 5-325 MG per tablet Take 1 tablet by mouth at bedtime as needed for pain.      Marland Kitchen levothyroxine (SYNTHROID) 25 MCG tablet Take 25 mcg by mouth daily.      . methocarbamol (ROBAXIN) 500 MG tablet Take 500 mg by mouth 4 (four) times daily.      . tamoxifen (NOLVADEX) 20 MG tablet Take 1 tablet (20 mg total) by mouth daily.  30 tablet  12  . traMADol (ULTRAM) 50 MG tablet Take 50 mg by mouth daily.       No current facility-administered medications for this visit.    Review of Systems Review of Systems  Constitutional: Negative.   Respiratory: Negative.   Cardiovascular: Negative.     Blood pressure 118/82, pulse 72, resp. rate 12, height 5\' 7"  (1.702 m), weight 170 lb (77.111 kg).  Physical  Exam Physical Exam  Constitutional: She is oriented to person, place, and time. She appears well-developed and well-nourished.  Eyes: Conjunctivae are normal. No scleral icterus.  Pulmonary/Chest:  Right lumpectomy site lower outer quadrant looks clean. Lateral end has a dry abrasion like area.  Lymphadenopathy:    She has no cervical adenopathy.  Neurological: She is alert and oriented to person, place, and time.  Skin: Skin is warm and dry.    Data Reviewed none  Assessment    DCIS right breast    Plan    Patient to return in November 2014 with a unilateral right breast diagnostic mammogram. This has been arranged for 03-05-13 at 10 am. She is aware of date, time, and instructions.        Stephanie Good G 01/04/2013, 7:04 PM

## 2013-01-12 ENCOUNTER — Ambulatory Visit: Payer: Self-pay | Admitting: Radiation Oncology

## 2013-01-12 ENCOUNTER — Telehealth: Payer: Self-pay

## 2013-01-12 NOTE — Telephone Encounter (Signed)
Patient called and said that she noticed a quarter sized reddned area on her right breast this morning. She completed radiation therapy on the breast about 6 weeks ago and has not had any problems since then. She stated that the area is warm to touch and has some mild tenderness when squeezed. Patient advised to be seen for this and is scheduled to see Dr Evette Cristal on 01/15/13 at 2:00 pm. She said she is going to call Dr Aggie Cosier over at the cancer center and see if she can be seen today. If not she will see [place] on Monday. Patient advised to call us with any significant change in her condition as we have a doctor on-call at all times. Patient expresses understanding and will notify us if she will be seen by Dr Aggie Cosier instead.

## 2013-01-15 ENCOUNTER — Ambulatory Visit: Payer: BC Managed Care – PPO | Admitting: General Surgery

## 2013-02-07 ENCOUNTER — Ambulatory Visit: Payer: Self-pay | Admitting: Radiation Oncology

## 2013-03-12 ENCOUNTER — Ambulatory Visit: Payer: BC Managed Care – PPO | Admitting: General Surgery

## 2013-03-20 ENCOUNTER — Ambulatory Visit: Payer: Self-pay | Admitting: General Surgery

## 2013-03-26 ENCOUNTER — Ambulatory Visit (INDEPENDENT_AMBULATORY_CARE_PROVIDER_SITE_OTHER): Payer: BC Managed Care – PPO | Admitting: General Surgery

## 2013-03-26 ENCOUNTER — Encounter: Payer: Self-pay | Admitting: General Surgery

## 2013-03-26 VITALS — BP 120/76 | HR 78 | Resp 12 | Ht 67.0 in | Wt 170.0 lb

## 2013-03-26 DIAGNOSIS — D0590 Unspecified type of carcinoma in situ of unspecified breast: Secondary | ICD-10-CM

## 2013-03-26 DIAGNOSIS — D0511 Intraductal carcinoma in situ of right breast: Secondary | ICD-10-CM

## 2013-03-26 NOTE — Patient Instructions (Signed)
Patient to return in 5 months.

## 2013-03-26 NOTE — Progress Notes (Signed)
Patient ID: Stephanie Good, female   DOB: Aug 15, 1970, 42 y.o.   MRN: [id number]  Chief Complaint  Patient presents with  . Follow-up    mammogram    HPI Stephanie Good is a 42 y.o. female who presents for a breast evaluation. The most recent mammogram was done on 03/20/13. Patient does perform regular self breast checks and gets regular mammograms done.  Patient is 7 months post right breast lumpectomy for DCIS followed by mammosite and radiation.  HPI  Past Medical History  Diagnosis Date  . Headache(784.0)   . Graves disease   . Hyperthyroidism   . Breast nodule     Fibrocystic nodule in the right breast  . Hemorrhoids   . Chronic back pain   . Diverticulosis   . Anxiety   . Hyperlipidemia   . Seasonal allergies     eye itching and swelling  . Depression   . Benign neoplasm of breast   . Personal history of tobacco use, presenting hazards to health   . Endocrine problem     thyroid goiter  . Breast screening, unspecified   . Lump or mass in breast   . Special screening for malignant neoplasms, colon   . Back pain 2013  . Fibromyalgia 2013  . Osteoporosis 2013    Past Surgical History  Procedure Laterality Date  . Endometrial ablation  July 2010  . Tubal ligation  2010  . Esophagogastroduodenoscopy  01/14/2011  . Colonoscopy  1995 and 2012  . Thyroid surgery  2008  . Tonsillectomy  1995  . Breast biopsy  2011  . Breast biopsy Right May 2014  . Breast lumpectomy Right 2014    DCIS    Family History  Problem Relation Age of Onset  . Adopted: Yes    Social History History  Substance Use Topics  . Smoking status: Former Smoker -- 0.50 packs/day for 20 years    Types: Cigarettes  . Smokeless tobacco: Never Used  . Alcohol Use: 1.5 - 2.5 oz/week    Allergies  Allergen Reactions  . Pollen Extract Itching    Eye swelling    Current Outpatient Prescriptions  Medication Sig Dispense Refill  . amitriptyline (ELAVIL) 25 MG tablet Take 1 tablet by mouth  daily.      . carisoprodol (SOMA) 350 MG tablet Take 1 tablet by mouth daily.      . cefadroxil (DURICEF) 500 MG capsule Take 1 capsule (500 mg total) by mouth 2 (two) times daily.  20 capsule  0  . clonazePAM (KLONOPIN) 1 MG tablet Take 0.5 mg by mouth 2 (two) times daily. Or as needed for panic attacks       . DULoxetine (CYMBALTA) 60 MG capsule Take 60 mg by mouth daily.      . fluconazole (DIFLUCAN) 150 MG tablet Take 1 tablet (150 mg total) by mouth once.  1 tablet  0  . HYDROcodone-acetaminophen (NORCO/VICODIN) 5-325 MG per tablet Take 1 tablet by mouth at bedtime as needed for pain.      Marland Kitchen levothyroxine (SYNTHROID) 25 MCG tablet Take 25 mcg by mouth daily.      . methocarbamol (ROBAXIN) 500 MG tablet Take 500 mg by mouth 4 (four) times daily.      . silver sulfADIAZINE (SILVADENE) 1 % cream 1 application daily.      . tamoxifen (NOLVADEX) 20 MG tablet Take 1 tablet (20 mg total) by mouth daily.  30 tablet  12  . traMADol (ULTRAM) 50 MG  tablet Take 50 mg by mouth daily.       No current facility-administered medications for this visit.    Review of Systems Review of Systems  Constitutional: Negative.   Respiratory: Negative.   Cardiovascular: Negative.     Blood pressure 120/76, pulse 78, resp. rate 12, height 5\' 7"  (1.702 m), weight 170 lb (77.111 kg).  Physical Exam Physical Exam  Constitutional: She is oriented to person, place, and time. She appears well-developed and well-nourished.  Eyes: Conjunctivae are normal. No scleral icterus.  Neck: No thyromegaly present.  Cardiovascular: Normal rate, regular rhythm and normal heart sounds.   Pulmonary/Chest: Effort normal and breath sounds normal. Right breast exhibits no inverted nipple, no mass, no nipple discharge, no skin change and no tenderness. Left breast exhibits no inverted nipple, no mass, no nipple discharge, no skin change and no tenderness.  Well-healed lumpectomy site of right breast in lower outer quadrant.   Abdominal: Soft. Bowel sounds are normal.  Lymphadenopathy:    She has no cervical adenopathy.    She has no axillary adenopathy.  Neurological: She is alert and oriented to person, place, and time.  Skin: Skin is warm and dry.    Data Reviewed Mammogram reviewed-stable  Assessment    DCIS of right breast, post lumpectomy and radiation. Currently on Tamoxifen. Stable exam.    Plan    Patient to return in 5 months with bilateral diagnostic mammogram.       SANKAR,SEEPLAPUTHUR G 03/26/2013, 11:44 AM

## 2013-04-11 ENCOUNTER — Encounter: Payer: Self-pay | Admitting: General Surgery

## 2013-05-10 ENCOUNTER — Ambulatory Visit: Payer: Self-pay | Admitting: Radiation Oncology

## 2013-06-03 ENCOUNTER — Ambulatory Visit: Payer: Self-pay | Admitting: Radiation Oncology

## 2013-06-18 ENCOUNTER — Ambulatory Visit: Payer: BC Managed Care – PPO | Admitting: General Surgery

## 2013-06-20 ENCOUNTER — Ambulatory Visit: Payer: Self-pay | Admitting: General Surgery

## 2013-06-20 ENCOUNTER — Encounter: Payer: Self-pay | Admitting: General Surgery

## 2013-06-20 ENCOUNTER — Ambulatory Visit (INDEPENDENT_AMBULATORY_CARE_PROVIDER_SITE_OTHER): Payer: BC Managed Care – PPO | Admitting: General Surgery

## 2013-06-20 VITALS — BP 132/86 | HR 88 | Resp 16 | Ht 67.0 in | Wt 178.0 lb

## 2013-06-20 DIAGNOSIS — R0781 Pleurodynia: Secondary | ICD-10-CM

## 2013-06-20 DIAGNOSIS — R079 Chest pain, unspecified: Secondary | ICD-10-CM

## 2013-06-20 NOTE — Patient Instructions (Addendum)
Patient to return as scheduled in recalls. Patient to try Celebrex daily. Also, to have rib x-ray today.

## 2013-06-20 NOTE — Progress Notes (Signed)
Patient ID: Stephanie Good, female   DOB: 02-05-1971, 43 y.o.   MRN: [id number]  Chief Complaint  Patient presents with  . Follow-up    right breast pain    HPI Stephanie Good is a 43 y.o. female here today for right breast pain near mammosite was placed. She states she has been having pain for two weeks. HPI  Past Medical History  Diagnosis Date  . Headache(784.0)   . Graves disease   . Hyperthyroidism   . Breast nodule     Fibrocystic nodule in the right breast  . Hemorrhoids   . Chronic back pain   . Diverticulosis   . Anxiety   . Hyperlipidemia   . Seasonal allergies     eye itching and swelling  . Depression   . Benign neoplasm of breast   . Personal history of tobacco use, presenting hazards to health   . Endocrine problem     thyroid goiter  . Breast screening, unspecified   . Lump or mass in breast   . Special screening for malignant neoplasms, colon   . Back pain 2013  . Fibromyalgia 2013  . Osteoporosis 2013    Past Surgical History  Procedure Laterality Date  . Endometrial ablation  July 2010  . Tubal ligation  2010  . Esophagogastroduodenoscopy  01/14/2011  . Colonoscopy  1995 and 2012  . Thyroid surgery  2008  . Tonsillectomy  1995  . Breast biopsy  2011  . Breast biopsy Right May 2014  . Breast lumpectomy Right 2014    DCIS    Family History  Problem Relation Age of Onset  . Adopted: Yes    Social History History  Substance Use Topics  . Smoking status: Former Smoker -- 0.50 packs/day for 20 years    Types: Cigarettes  . Smokeless tobacco: Never Used  . Alcohol Use: 1.5 - 2.5 oz/week    Allergies  Allergen Reactions  . Pollen Extract Itching    Eye swelling    Current Outpatient Prescriptions  Medication Sig Dispense Refill  . amitriptyline (ELAVIL) 25 MG tablet Take 1 tablet by mouth daily.      . carisoprodol (SOMA) 350 MG tablet Take 1 tablet by mouth daily.      . cefadroxil (DURICEF) 500 MG capsule Take 1 capsule (500 mg  total) by mouth 2 (two) times daily.  20 capsule  0  . celecoxib (CELEBREX) 200 MG capsule 1 capsule daily.      . clonazePAM (KLONOPIN) 1 MG tablet Take 0.5 mg by mouth 2 (two) times daily. Or as needed for panic attacks       . DULoxetine (CYMBALTA) 60 MG capsule Take 60 mg by mouth daily.      . fluconazole (DIFLUCAN) 150 MG tablet Take 1 tablet (150 mg total) by mouth once.  1 tablet  0  . HYDROcodone-acetaminophen (NORCO/VICODIN) 5-325 MG per tablet Take 1 tablet by mouth at bedtime as needed for pain.      Marland Kitchen levothyroxine (SYNTHROID) 25 MCG tablet Take 25 mcg by mouth daily.      . methocarbamol (ROBAXIN) 500 MG tablet Take 500 mg by mouth 4 (four) times daily.      Marland Kitchen PROAIR HFA 108 (90 BASE) MCG/ACT inhaler 1 puff daily.      . silver sulfADIAZINE (SILVADENE) 1 % cream 1 application daily.      . tamoxifen (NOLVADEX) 20 MG tablet Take 1 tablet (20 mg total) by mouth daily.  [place]  tablet  12  . traMADol (ULTRAM) 50 MG tablet Take 50 mg by mouth daily.       No current facility-administered medications for this visit.    Review of Systems Review of Systems  Constitutional: Negative.   Respiratory: Negative.   Cardiovascular: Negative.     Blood pressure 132/86, pulse 88, resp. rate 16, height 5\' 7"  (1.702 m), weight 178 lb (80.74 kg), last menstrual period 06/06/2013.  Physical Exam Physical Exam  Constitutional: She is oriented to person, place, and time. She appears well-developed and well-nourished.  Eyes: Conjunctivae are normal.  Pulmonary/Chest: Right breast exhibits no inverted nipple, no mass, no nipple discharge, no skin change and no tenderness.  Tender over the ribs underlying the right breast lumpectomy site. No palpable mass at lumpectomy site..   Neurological: She is alert and oriented to person, place, and time.  Skin: Skin is warm and dry.    Data Reviewed none  Assessment   Pain over the right chest wall.No finding at lumpectomy site. Patient report serves  coughing episode two months ago.Possible rib fracture. Plan    Patient is in recalls to return as scheduled for April 2015.     This patient has also been asked to have right rib details x-ray at [facility] today. She is aware of all instructions.  Also advised use of celebrex 200mg  daily-pt has some of this med at home, has not used any in recent past.   Christene Lye 06/20/2013, 12:35 PM

## 2013-06-22 ENCOUNTER — Telehealth: Payer: Self-pay | Admitting: *Deleted

## 2013-06-22 NOTE — Telephone Encounter (Signed)
Message copied by Dominga Ferry on Fri Jun 22, 2013  1:17 PM ------      Message from: Christene Lye      Created: Fri Jun 22, 2013 10:39 AM       Right rib details show no fracture.. Please inform pt. ------

## 2013-06-22 NOTE — Telephone Encounter (Signed)
Message left on home and cell numbers for patient to call the office.  

## 2013-06-25 ENCOUNTER — Telehealth: Payer: Self-pay | Admitting: *Deleted

## 2013-06-25 NOTE — Telephone Encounter (Signed)
Patient notified as instructed and she verbalizes understanding. However, she would like to know if you have any idea where her pain is coming from.   This patient was also reminded that she is in the recalls for a mammogram for April 2015 and will be sent a letter once it is time for her to return for follow up.

## 2013-08-09 ENCOUNTER — Ambulatory Visit: Payer: Self-pay | Admitting: General Surgery

## 2013-08-13 ENCOUNTER — Encounter: Payer: Self-pay | Admitting: General Surgery

## 2013-08-13 ENCOUNTER — Ambulatory Visit: Payer: BC Managed Care – PPO | Admitting: General Surgery

## 2013-08-28 ENCOUNTER — Ambulatory Visit: Payer: BC Managed Care – PPO | Admitting: General Surgery

## 2013-11-09 ENCOUNTER — Ambulatory Visit: Payer: Self-pay | Admitting: Radiation Oncology

## 2013-12-05 ENCOUNTER — Telehealth: Payer: Self-pay | Admitting: *Deleted

## 2013-12-05 NOTE — Telephone Encounter (Signed)
[facility] had called and left a message with the answering service about needing refill on patient's Tamoxifen. She is completely out.  Dr. Jamal Collin approved Tamoxifen 20 mg one POP daily 12 refills. Sam at [facility] is aware of this.

## 2014-03-04 ENCOUNTER — Encounter: Payer: Self-pay | Admitting: General Surgery

## 2014-04-01 IMAGING — MG MM BREAST SURGICAL SPECIMEN
1 series · 1 of 1 positions shown · non-contrast
Comparison: Needle wire localization performed same day.

REASON FOR EXAM: ROUTINE
COMMENTS:

PROCEDURE:     MAM - MAM BREAST SPECIMEN  - October 13, 2012 [DATE]
RESULT:

[R CC]
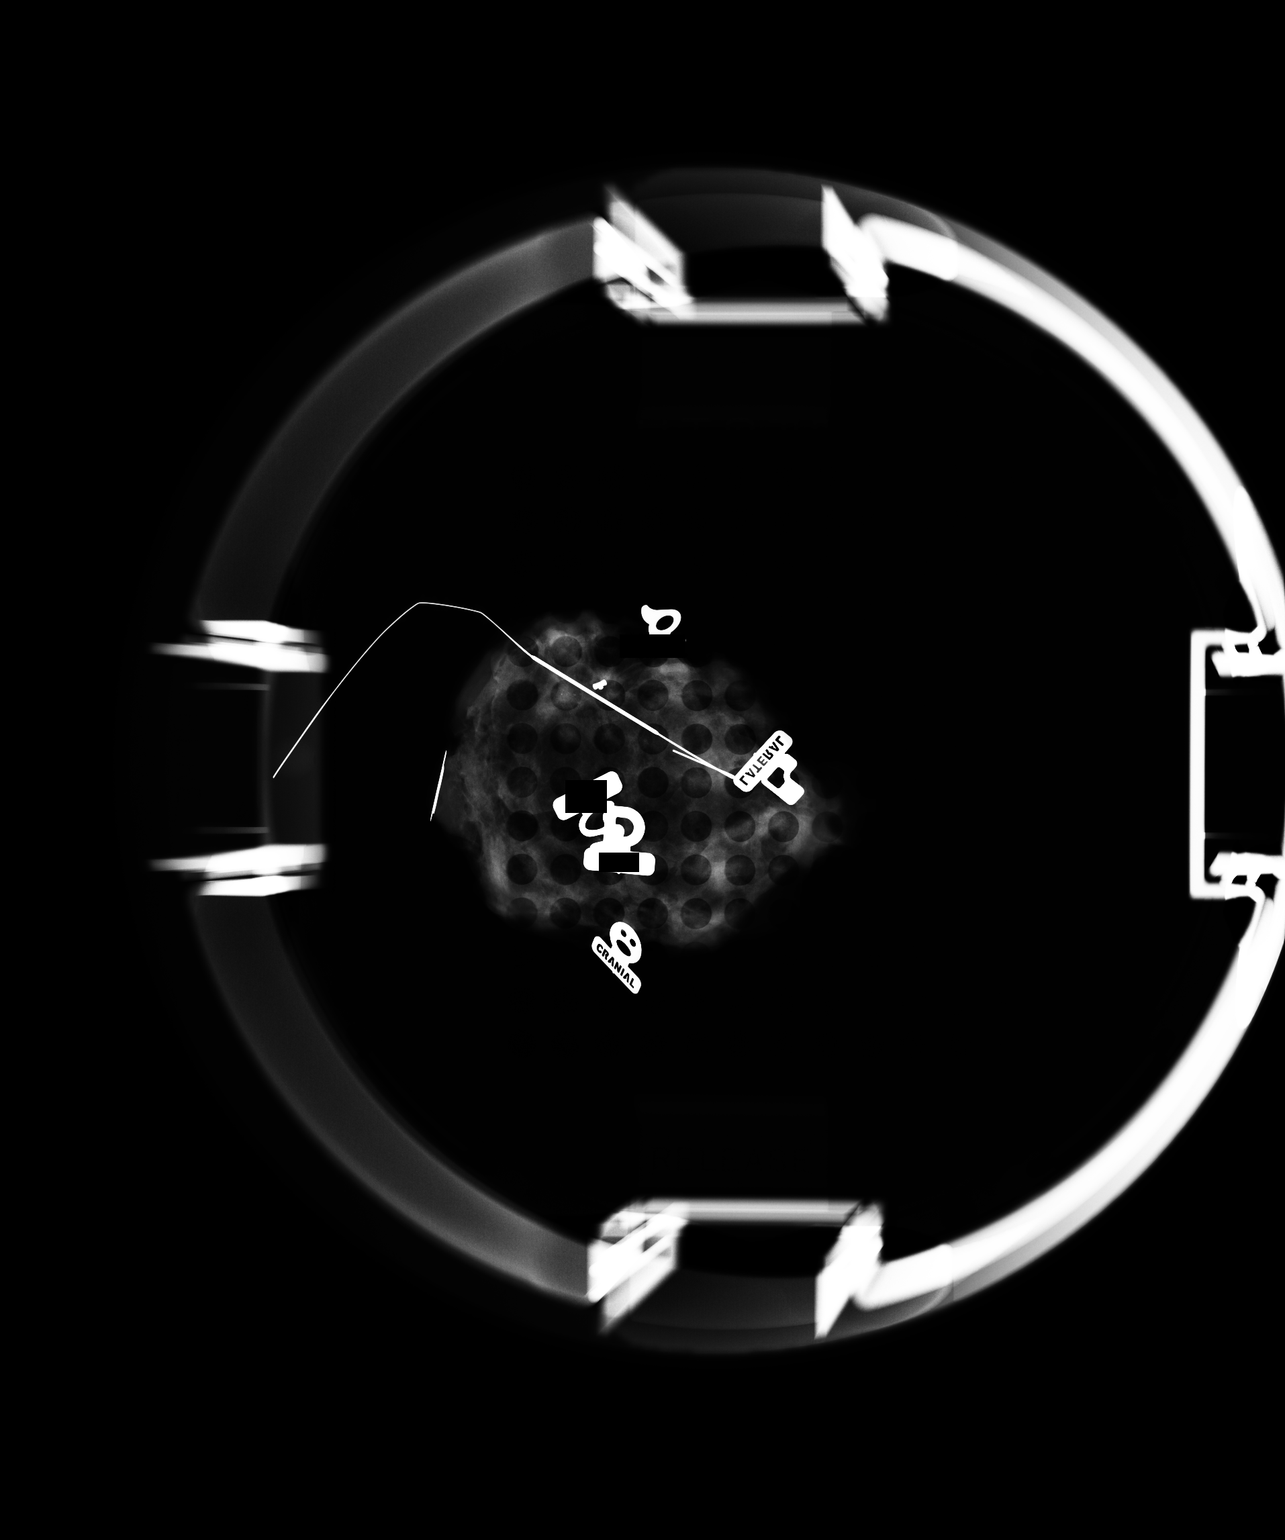

[1 of 1 positions shown; findings below may reference images not displayed]

FINDINGS: Single specimen radiograph was submitted. The wire is within the specimen.
The biopsy clip is within the specimen. There are a few microcalcifications
within the specimen.
IMPRESSION: 1. The biopsy clip and a few microcalcifications are present within the
specimen.

## 2014-08-23 NOTE — Consult Note (Signed)
Reason for Visit: This 44 year old Female patient presents to the clinic for initial evaluation of  breast cancer .   Referred by Dr. Jamal Collin.  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  Pathology Report pathology were reviewed   Imaging Report mammograms reviewed   Referral Report clinical notes reviewed   Planned Treatment Regimen accelerated partial breast radiation   HPI   patient is a 44 year old female who presented with an abnormal mammogramof the right breast showing new grouped calcifications in the lower outer quadrant for which surgical consultation was recommended. 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  She went on to have a wide local excision showing a 1.3 cm lesion of grade 2 ductal carcinoma in situ. Inferior anterior margin was positive although it was reexcised with negative margins.  No sentinel lymph node was performed. Patient had BRCA1/2 testing and was negative. She tolerated her surgery well. She does state preoperative was she did have some nipple discharge and also she's complaint for years of some tenderness in the right axillary tail. She is now referred to radiation oncology for opinion. She is healing well and is without complaint.  Past Hx:    Hemorrhoids:    Diverticulosis:    Anemia:    Graves Disease:    DCIS Right Breast:    Depression:    Anxiety:    Migraines:    Hypothyroidism:    Fibromyalgia:    Colonoscopy/EGD:    Breast Biopsy:    Tonsillectomy:    Essure Procedure:   Past, Family and Social History:  Past Medical History positive   Cardiovascular hyperlipidemia   Gastrointestinal diverticulitis; hemorrhoids   Endocrine hypothyroidism; Graves' disease   Neurological/Psychiatric anxiety; depression; migraine   Past Surgical  History tonsillectomy,  endometrial epilation, tubal ligation   Past Medical History Comments fibromyalgia, anemia, chronic back pain   Family History positive   Family History Comments patient is adopted   Social History positive   Social History Comments 20-pack-year smoking history, social EtOH use history   Additional Past Medical and Surgical History sseen by herself today   Allergies:   No Known Allergies:   Home Meds:  Home Medications: Medication Instructions Status  clonazePAM 1 mg oral tablet 1 tab(s) orally 2 times a day, As Needed Active  levothyroxine 100 mcg (0.1 mg) oral capsule 1 cap(s) orally once a day Active  methocarbamol 500 mg oral tablet 1 tab(s) orally 3 times a day, As Needed Active  multivitamin 1 tab(s) orally once a day Active  acetaminophen-HYDROcodone 325 mg-5 mg oral tablet 1 tab(s) orally every 6 hours, As Needed Active  traMADol 50 mg oral tablet 1 tab(s) orally every 4 hours, As Needed Active   Review of Systems:  General negative   Performance Status (ECOG) 0   Skin negative   Breast see HPI   Ophthalmologic negative   ENMT negative   Respiratory and Thorax negative   Cardiovascular negative   Gastrointestinal negative   Genitourinary negative   Musculoskeletal negative   Neurological negative   Psychiatric negative   Hematology/Lymphatics negative   Endocrine negative   Allergic/Immunologic negative   Review of Systems   according to the nurse's notesPatient denies any weight loss, fatigue, weakness, fever, chills or night sweats. Patient denies any loss of vision, blurred vision. Patient  denies any ringing  of the ears or hearing loss. No irregular heartbeat. Patient denies heart murmur or history of fainting. Patient denies any chest pain or pain radiating to her upper extremities. Patient denies any shortness of breath, difficulty breathing at night, cough or hemoptysis. Patient denies any swelling in the lower legs.  Patient denies any nausea vomiting, vomiting of blood, or coffee ground material in the vomitus. Patient denies any stomach pain. Patient states has had normal bowel movements no significant constipation or diarrhea. Patient denies any dysuria, hematuria or significant nocturia. Patient denies any problems walking, swelling in the joints or loss of balance. Patient denies any skin changes, loss of hair or loss of weight. Patient denies any excessive worrying or anxiety or significant depression. Patient denies any problems with insomnia. Patient denies excessive thirst, polyuria, polydipsia. Patient denies any swollen glands, patient denies easy bruising or easy bleeding. Patient denies any recent infections, allergies or URI. Patient "s visual fields have not changed significantly in recent time.  Nursing Notes:  Nursing Vital Signs and Chemo Nursing Nursing Notes: *CC Vital Signs Flowsheet:   23-Jun-14 11:34  Temp Temperature 96.3  Pulse Pulse 77  Respirations Respirations 18  SBP SBP 112  DBP DBP 71  Current Weight (kg) (kg) 77   Physical Exam:  General/Skin/HEENT:  General normal   Skin normal   Eyes normal   ENMT normal   Head and Neck normal   Additional PE well-developed female in NAD. Right breast is a wide local excision which is healing well. No dominant mass or nodularity is noted in either breast into position examined. Her discomfort in her right axillary tail I could not coordinate with any nodularity or mass in that region. No axillary or supraclavicular adenopathy is appreciated. Lungs are clear to A&P cardiac examination shows regular rate and rhythm.   Breasts/Resp/CV/GI/GU:  Respiratory and Thorax normal   Cardiovascular normal   Gastrointestinal normal   Genitourinary normal   MS/Neuro/Psych/Lymph:  Musculoskeletal normal   Neurological normal   Lymphatics normal   Other Results:  Radiology Results: LabUnknown:    13-Jun-14 10:33, Breast Needle  Localization -Right Breast  PACS [place]:  Breast Needle Localization -Right Breast   REASON FOR EXAM:    NL 1000AM SURG 1230PM [id number]  COMMENTS:       PROCEDURE: MAM - MAM BREAST NEEDLE LOCAL RT  - Oct 13 2012 10:33AM     RESULT:     Comparison: Mammograms from [facility] 08/16/2012 and   08/18/2012.    Procedure:  The preprocedure mammograms were reviewed. After all risks and benefits   were explained to the patient and all questions were answered, informed   consent was obtained. The patient's right breast was placed in CC   compression. A formal departmental time-out procedure was performed. The     biopsy clip marker in the inferolateral right breast was localized. The   skin was cleansed in the usual sterile fashion. Local anesthesia was   provided with approximately 2 mL of 1% lidocaine. A 7 cm Kopans needle   was advanced through the region of the biopsy clip from an inferior   position. After the initial mammogram, the needle was repositioned   slightly. Subsequent mammogram demonstrated the needle in appropriate   position. The patient's right breastwas taken out of CC compression and   placed in true lateral compression. The needle was slightly withdrawn.   The wire was placed through  the needle and the needle was removed from   the breast. The subsequent mammogram demonstrated the biopsy clip just   posterior to the midportion of the thickener of the wire.    The patient tolerated the procedure well. The mammograms were labeled and   sent to the Operative Suite with the patient.  IMPRESSION:      1. Needle wire localization of the biopsy clip marker in the   inferolateral right breast.    Thank you for the opportunity to contribute to the care of your patient.         Verified By: Gregor Hams, M.D., MD   Relevent Results:   Relevant Scans and Labs mammograms are reviewed.   Assessment and Plan: Impression:   stage 0  ER/PR  positive ductal carcinoma in situ status post wide local excision of the right breast in 44 year old female. Plan:   at this time I got over treatment recommendations with the patient including whole breast radiation versus accelerated partial breast irradiation. She is a caution medication based on her age and ductal carcinoma in situ status. Have talked with Dr. Jamal Collin personally about the margin status and he has again confirm with pathology based on his reexcision she does have negative margins. Patient is also interested based on family needs and work issues to go ahead with accelerated partial breast radiation if that is possible. She does have a low-grade ductal carcinoma in situ ER/PR positive and should be a suitable candidate. Risks and benefits of treatment were reviewed with the patient including further procedure to place MammoSite catheter, possible skin irritation, possible thickening in the lumpectomy site. We will plan attempt at balloon catheter placement. Patient is aware that if catheter is too close to skin surface her chest wall we may go need to go back to whole breast radiation as our second option. Patient will also be a candidate for tamoxifen therapy after completion of radiation.  I would like to take this opportunity to thank you for allowing me to continue to participate in this patient's care.  CC Referral:  cc: Dr. Jamal Collin, Dr. Juluis Pitch   Electronic Signatures: Baruch Gouty Roda Shutters (MD)  (Signed 23-Jun-14 13:14)  Authored: HPI, Diagnosis, Past Hx, PFSH, Allergies, Home Meds, ROS, Nursing Notes, Physical Exam, Other Results, Relevent Results, Encounter Assessment and Plan, CC Referring Physician   Last Updated: 23-Jun-14 13:14 by Armstead Peaks (MD)

## 2014-08-23 NOTE — Op Note (Signed)
PATIENT NAME:  Stephanie Good, Stephanie Good MR#:  [id number] DATE OF BIRTH:  Jun 20, 1970  DATE OF PROCEDURE:  10/13/2012  PREOPERATIVE DIAGNOSIS: Ductal carcinoma in situ, right breast.   POSTOPERATIVE DIAGNOSIS: Ductal carcinoma in situ, right breast.    OPERATION: Right breast lumpectomy.   SURGEON: S.G. Jamal Collin, M.D.   ANESTHESIA: General.   COMPLICATIONS: None.   ESTIMATED BLOOD LOSS: Less than 20 mL.   DRAINS: None.   DESCRIPTION OF PROCEDURE: The patient is brought to the operating room after she underwent wire localization of the site of biopsy in the lower outer quadrant of her right breast. She was placed in the supine position on the operating table. After satisfactory anesthesia was established, the right breast was prepped and draped out as a sterile field. Time out was performed. The location of this small focus that was biopsied via stereotactic technique was in the mid to lower outer quadrant at about the 7 to 8 o'clock position. The wire entrance was located going from the medial to the lateral aspect. After review of the mammogram, it was decided to do a curvilinear incision in the right lower outer quadrant and this was then made after instillation of 10 mL of  0.5% Marcaine for postop analgesia. The wire was freed from the skin. The skin and subcutaneous tissue were then elevated on both sides and the glandular element with the wire in place was excised out allowing for good core of tissue around it. It appeared that there was a firm spot located closer to the inferior edge of this. Some additional tissue was taken from the inferior portion of the lumpectomy site and tagged separately. The main specimen was tagged for margins. Specimen mammogram confirmed the presence of the clip within the lesion. It was then sent to pathology. Hemostasis was obtained with cautery. The deeper tissue was closed with interrupted 2-0 Vicryl stitches. The skin closed with subcuticular 4-0 Vicryl, covered with  Dermabond. The procedure was well tolerated. She was subsequently extubated and returned to the recovery room in stable condition.   ____________________________ S.Robinette Haines, MD sgs:aw D: 10/16/2012 07:36:37 ET T: 10/16/2012 08:46:34 ET JOB#: [id number]  cc: Synthia Innocent. Jamal Collin, MD, <Dictator> [facility] Robinette Haines MD ELECTRONICALLY SIGNED 10/16/2012 11:52

## 2015-01-07 DIAGNOSIS — R32 Unspecified urinary incontinence: Secondary | ICD-10-CM | POA: Insufficient documentation

## 2015-01-07 DIAGNOSIS — N8003 Adenomyosis of the uterus: Secondary | ICD-10-CM | POA: Insufficient documentation

## 2015-01-07 DIAGNOSIS — K644 Residual hemorrhoidal skin tags: Secondary | ICD-10-CM | POA: Insufficient documentation

## 2015-01-07 DIAGNOSIS — N819 Female genital prolapse, unspecified: Secondary | ICD-10-CM | POA: Insufficient documentation

## 2015-01-07 DIAGNOSIS — N946 Dysmenorrhea, unspecified: Secondary | ICD-10-CM | POA: Insufficient documentation

## 2015-01-07 DIAGNOSIS — N809 Endometriosis, unspecified: Secondary | ICD-10-CM

## 2015-02-17 DIAGNOSIS — N939 Abnormal uterine and vaginal bleeding, unspecified: Secondary | ICD-10-CM | POA: Insufficient documentation

## 2015-02-17 DIAGNOSIS — N819 Female genital prolapse, unspecified: Secondary | ICD-10-CM | POA: Insufficient documentation

## 2015-11-17 DIAGNOSIS — F332 Major depressive disorder, recurrent severe without psychotic features: Secondary | ICD-10-CM | POA: Insufficient documentation

## 2015-11-17 DIAGNOSIS — E039 Hypothyroidism, unspecified: Secondary | ICD-10-CM | POA: Insufficient documentation

## 2015-11-17 DIAGNOSIS — R51 Headache: Secondary | ICD-10-CM

## 2015-11-17 DIAGNOSIS — R519 Headache, unspecified: Secondary | ICD-10-CM | POA: Insufficient documentation

## 2015-12-10 ENCOUNTER — Encounter: Payer: Self-pay | Admitting: Licensed Clinical Social Worker

## 2015-12-10 ENCOUNTER — Ambulatory Visit (INDEPENDENT_AMBULATORY_CARE_PROVIDER_SITE_OTHER): Payer: 59 | Admitting: Licensed Clinical Social Worker

## 2015-12-10 DIAGNOSIS — F332 Major depressive disorder, recurrent severe without psychotic features: Secondary | ICD-10-CM
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
## 2015-12-18 ENCOUNTER — Ambulatory Visit (INDEPENDENT_AMBULATORY_CARE_PROVIDER_SITE_OTHER): Payer: 59 | Admitting: Psychiatry

## 2015-12-18 ENCOUNTER — Encounter: Payer: Self-pay | Admitting: Psychiatry

## 2015-12-18 VITALS — BP 116/77 | HR 71 | Temp 97.8°F | Ht 67.0 in | Wt 167.0 lb

## 2015-12-18 DIAGNOSIS — F39 Unspecified mood [affective] disorder: Secondary | ICD-10-CM | POA: Diagnosis not present

## 2015-12-18 DIAGNOSIS — S61219A Laceration without foreign body of unspecified finger without damage to nail, initial encounter: Secondary | ICD-10-CM | POA: Insufficient documentation

## 2015-12-18 DIAGNOSIS — M549 Dorsalgia, unspecified: Secondary | ICD-10-CM | POA: Insufficient documentation

## 2015-12-18 DIAGNOSIS — G5602 Carpal tunnel syndrome, left upper limb: Secondary | ICD-10-CM | POA: Insufficient documentation

## 2015-12-18 DIAGNOSIS — F1394 Sedative, hypnotic or anxiolytic use, unspecified with sedative, hypnotic or anxiolytic-induced mood disorder: Secondary | ICD-10-CM

## 2015-12-18 DIAGNOSIS — G5601 Carpal tunnel syndrome, right upper limb: Secondary | ICD-10-CM | POA: Insufficient documentation

## 2015-12-18 DIAGNOSIS — M797 Fibromyalgia: Secondary | ICD-10-CM

## 2015-12-18 DIAGNOSIS — M19049 Primary osteoarthritis, unspecified hand: Secondary | ICD-10-CM | POA: Insufficient documentation

## 2015-12-18 MED ORDER — BREXPIPRAZOLE 0.5 MG PO TABS: 0.5000 mg | ORAL_TABLET | Freq: Every day | ORAL | 0 refills | Status: DC
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
## 2015-12-24 ENCOUNTER — Ambulatory Visit (INDEPENDENT_AMBULATORY_CARE_PROVIDER_SITE_OTHER): Payer: 59 | Admitting: Licensed Clinical Social Worker

## 2015-12-24 DIAGNOSIS — F39 Unspecified mood [affective] disorder: Secondary | ICD-10-CM

## 2015-12-24 NOTE — Progress Notes (Signed)
   THERAPIST PROGRESS NOTE  Session Time: 11 AM to 11:50 AM  Participation Level: Active  Behavioral Response: CasualAlertDysphoric  Type of Therapy: Individual Therapy  Treatment Goals addressed:  stress management, coping skills, improvement in mood, creating a healthy lifestyle  Interventions: Solution Focused, Strength-based, Supportive and Reframing  Summary: Stephanie Good is a 45 y.o. female who presents with having problems with adjustment to medication. She reports sweats, can't sleep, nausea and in general having a hard time functioning. Reviewed medication changes with patient from doctor's note. Patient recognizes that it does take time to adjust to medication changes. She is going to [facility] tomorrow and will review again tomorrow and give it some time to see if she can adjust to these changes. She related she does not want to talk to her husband about her problems because she does not want to worry him. She also reported having 2 panic attacks induced by pain and had been getting to the point where I are not happening. Patient able to identify some of the positive developments she recognizes that it was meant to be to move back to this area. She and mom are supporting each other and the family is helping each other through everything. She relates that she loves being in this area and things are better with her older son, Harmon Pier. She related that move to [facility] was helpful for family in  helping their mind get clearer. Identified accomplishment of positive goal of losing 30 pounds. Patient relates doing Pilates in the morning and had been going to the gym every day. Patient completed treatment plan, related to therapist that she felt "blah" and wanted to be happier. She wanted to learn coping strategies to manage her stress better.   Suicidal/Homicidal: No  Therapist Response: Therapist reviewed medication changes and discuss that with med changes it takes time to adjust to new  medications. Also discussed with nurse, Beather Arbour, and Dr. Gretel Acre was able to come in at end of session to discuss patient's medications with patient. Provided supportive interventions and providing an outlet for patient to open up about her struggles that she does not discuss with family. Provided strength-based interventions and reframing for patient to focus on some of the positive developments in her life. Completed treatment plan and will focus on helping patient with stress management, learning effective coping strategies, taking steps to create a healthier lifestyle and mood improvement. Encouraged patient with regular exercise and shock sure she is putting in place in her life.  Plan: Return again in 2 weeks.2. Patient use therapy to learn and apply effective coping strategies to help with mood.  Diagnosis: Axis I: Episodic mood disorder    Axis II: No diagnosis    Nathanial Arrighi A, LCSW 12/24/2015

## 2015-12-30 ENCOUNTER — Other Ambulatory Visit: Payer: Self-pay | Admitting: Psychiatry

## 2016-01-02 ENCOUNTER — Other Ambulatory Visit: Payer: Self-pay | Admitting: Psychiatry

## 2016-01-07 ENCOUNTER — Ambulatory Visit: Payer: 59 | Admitting: Licensed Clinical Social Worker

## 2016-01-19 ENCOUNTER — Ambulatory Visit: Payer: 59 | Admitting: Psychiatry

## 2016-01-20 ENCOUNTER — Encounter: Payer: Self-pay | Admitting: Psychiatry

## 2016-01-20 ENCOUNTER — Other Ambulatory Visit: Payer: Self-pay | Admitting: Psychiatry

## 2016-01-20 ENCOUNTER — Ambulatory Visit (INDEPENDENT_AMBULATORY_CARE_PROVIDER_SITE_OTHER): Payer: 59 | Admitting: Psychiatry

## 2016-01-20 VITALS — BP 115/79 | HR 96 | Wt 168.4 lb

## 2016-01-20 DIAGNOSIS — F1394 Sedative, hypnotic or anxiolytic use, unspecified with sedative, hypnotic or anxiolytic-induced mood disorder: Secondary | ICD-10-CM | POA: Diagnosis not present

## 2016-01-20 DIAGNOSIS — F39 Unspecified mood [affective] disorder: Secondary | ICD-10-CM

## 2016-01-20 MED ORDER — BREXPIPRAZOLE 0.5 MG PO TABS: 0.5000 mg | ORAL_TABLET | Freq: Every day | ORAL | 0 refills | Status: AC

## 2016-01-20 MED ORDER — DULOXETINE HCL 30 MG PO CPEP: 30.0000 mg | ORAL_CAPSULE | Freq: Every morning | ORAL | 1 refills | Status: AC

## 2016-01-20 MED ORDER — CLONAZEPAM 1 MG PO TABS: 0.5000 mg | ORAL_TABLET | Freq: Two times a day (BID) | ORAL | 0 refills | Status: AC

## 2016-01-20 MED ORDER — DULOXETINE HCL 60 MG PO CPEP: 60.0000 mg | ORAL_CAPSULE | Freq: Every morning | ORAL | 1 refills | Status: AC

## 2016-01-20 NOTE — Telephone Encounter (Signed)
Refilled today
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
## 2016-01-21 ENCOUNTER — Ambulatory Visit: Payer: 59 | Admitting: Psychiatry

## 2016-01-23 ENCOUNTER — Other Ambulatory Visit: Payer: Self-pay | Admitting: Psychiatry

## 2016-01-27 ENCOUNTER — Telehealth: Payer: Self-pay

## 2016-01-27 NOTE — Telephone Encounter (Signed)
pt called states that she did not realized that her pcp had filled her klonopin and when she went to pick it up at the pharmacy it was already baged and she didn't even look at the doctor.  she stated she didn't know .  she states she will turn in the klonopin from her pcp if you would give her a refill.  She wants to get her medication from you.

## 2016-02-02 ENCOUNTER — Telehealth: Payer: Self-pay | Admitting: Psychiatry

## 2016-02-02 NOTE — Telephone Encounter (Signed)
Returned call to Dr Susie Cassette.[place] at [facility] at [phone] and LM.

## 2016-02-02 NOTE — Telephone Encounter (Signed)
please call him on his cell at  [phone] he will be out of the office tue thur thur back friday.  but he wants you to call him on  his cell please

## 2016-02-03 NOTE — Telephone Encounter (Signed)
Tried calling Dr Marian Sorrow but the number is not working at this time. Will try again later.

## 2016-02-05 ENCOUNTER — Telehealth: Payer: Self-pay | Admitting: *Deleted

## 2016-02-05 NOTE — Telephone Encounter (Signed)
Release obtained, forms faxed

## 2016-02-05 NOTE — Telephone Encounter (Signed)
Received EMSI form to be completed for [employer], need authorization to release, need form signed if possible.

## 2016-02-19 ENCOUNTER — Ambulatory Visit: Payer: Self-pay | Admitting: Psychiatry

## 2016-03-03 ENCOUNTER — Ambulatory Visit: Payer: 59 | Admitting: Licensed Clinical Social Worker

## 2016-05-20 ENCOUNTER — Encounter: Payer: Self-pay | Admitting: Emergency Medicine

## 2016-05-20 ENCOUNTER — Emergency Department: Payer: 59

## 2016-05-20 ENCOUNTER — Emergency Department
Admission: EM | Admit: 2016-05-20 | Discharge: 2016-05-20 | Disposition: A | Discharge status: Home / Self Care | Payer: 59 | Attending: Emergency Medicine | Admitting: Emergency Medicine

## 2016-05-20 DIAGNOSIS — K8050 Calculus of bile duct without cholangitis or cholecystitis without obstruction: Secondary | ICD-10-CM | POA: Insufficient documentation

## 2016-05-20 DIAGNOSIS — E039 Hypothyroidism, unspecified: Secondary | ICD-10-CM | POA: Insufficient documentation

## 2016-05-20 DIAGNOSIS — R1013 Epigastric pain: Secondary | ICD-10-CM | POA: Insufficient documentation

## 2016-05-20 DIAGNOSIS — Z791 Long term (current) use of non-steroidal anti-inflammatories (NSAID): Secondary | ICD-10-CM | POA: Insufficient documentation

## 2016-05-20 DIAGNOSIS — Z87891 Personal history of nicotine dependence: Secondary | ICD-10-CM | POA: Insufficient documentation

## 2016-05-20 DIAGNOSIS — Z79899 Other long term (current) drug therapy: Secondary | ICD-10-CM | POA: Insufficient documentation

## 2016-05-20 LAB — COMPREHENSIVE METABOLIC PANEL
ALBUMIN: 4.7 g/dL (ref 3.5–5.0)
ALT: 24 U/L (ref 14–54)
AST: 34 U/L (ref 15–41)
Alkaline Phosphatase: 84 U/L (ref 38–126)
Anion gap: 10 (ref 5–15)
BUN: 11 mg/dL (ref 6–20)
CHLORIDE: 103 mmol/L (ref 101–111)
CO2: 24 mmol/L (ref 22–32)
CREATININE: 0.75 mg/dL (ref 0.44–1.00)
Calcium: 9.7 mg/dL (ref 8.9–10.3)
GFR calc Af Amer: >60 mL/min (ref >60)
GLUCOSE: 91 mg/dL (ref 65–99)
POTASSIUM: 4 mmol/L (ref 3.5–5.1)
Sodium: 137 mmol/L (ref 135–145)
Total Bilirubin: 0.2 mg/dL — ABNORMAL LOW (ref 0.3–1.2)
Total Protein: 8 g/dL (ref 6.5–8.1)

## 2016-05-20 LAB — URINALYSIS, COMPLETE (UACMP) WITH MICROSCOPIC
BILIRUBIN URINE: NEGATIVE
Glucose, UA: NEGATIVE mg/dL
Hgb urine dipstick: NEGATIVE
KETONES UR: NEGATIVE mg/dL
LEUKOCYTES UA: NEGATIVE
Nitrite: NEGATIVE
PH: 7 (ref 5.0–8.0)
PROTEIN: NEGATIVE mg/dL
Specific Gravity, Urine: 1.005 (ref 1.005–1.030)
WBC UA: NONE SEEN WBC/hpf (ref 0–5)

## 2016-05-20 LAB — CBC WITH DIFFERENTIAL/PLATELET
BASOS ABS: 0 10*3/uL (ref 0–0.1)
Basophils Relative: 0 %
EOS PCT: 2 %
Eosinophils Absolute: 0.2 10*3/uL (ref 0–0.7)
HEMATOCRIT: 42.5 % (ref 35.0–47.0)
Hemoglobin: 14.5 g/dL (ref 12.0–16.0)
LYMPHS PCT: 19 %
Lymphs Abs: 2.1 10*3/uL (ref 1.0–3.6)
MCH: 30.2 pg (ref 26.0–34.0)
MCHC: 34.2 g/dL (ref 32.0–36.0)
MCV: 88.2 fL (ref 80.0–100.0)
MONO ABS: 0.7 10*3/uL (ref 0.2–0.9)
Monocytes Relative: 6 %
NEUTROS ABS: 8 10*3/uL — AB (ref 1.4–6.5)
Neutrophils Relative %: 73 %
PLATELETS: 252 10*3/uL (ref 150–440)
RBC: 4.81 MIL/uL (ref 3.80–5.20)
RDW: 13.1 % (ref 11.5–14.5)
WBC: 11.1 10*3/uL — AB (ref 3.6–11.0)

## 2016-05-20 LAB — POCT PREGNANCY, URINE: PREG TEST UR: NEGATIVE

## 2016-05-20 LAB — LIPASE, BLOOD: Lipase: 38 U/L (ref 11–51)

## 2016-05-20 LAB — ETHANOL

## 2016-05-20 MED ORDER — LORAZEPAM 2 MG/ML IJ SOLN
1.0000 mg | Freq: Once | INTRAMUSCULAR | Status: AC
  Administered 2016-05-20: 1 mg via INTRAVENOUS
  Filled 2016-05-20: qty 1

## 2016-05-20 MED ORDER — HYDROMORPHONE HCL 1 MG/ML IJ SOLN
0.5000 mg | Freq: Once | INTRAMUSCULAR | Status: AC
  Administered 2016-05-20: 0.5 mg via INTRAVENOUS
  Filled 2016-05-20: qty 1

## 2016-05-20 MED ORDER — GI COCKTAIL ~~LOC~~
30.0000 mL | Freq: Once | ORAL | Status: AC
  Administered 2016-05-20: 30 mL via ORAL
  Filled 2016-05-20: qty 30

## 2016-05-20 MED ORDER — FAMOTIDINE IN NACL 20-0.9 MG/50ML-% IV SOLN
20.0000 mg | Freq: Once | INTRAVENOUS | Status: AC
  Administered 2016-05-20: 20 mg via INTRAVENOUS
  Filled 2016-05-20: qty 50

## 2016-05-20 MED ORDER — IOPAMIDOL (ISOVUE-300) INJECTION 61%
100.0000 mL | Freq: Once | INTRAVENOUS | Status: AC | PRN
  Administered 2016-05-20: 100 mL via INTRAVENOUS

## 2016-05-20 MED ORDER — HYDROMORPHONE HCL 1 MG/ML IJ SOLN: 0.5000 mg | INTRAMUSCULAR | Status: DC | PRN

## 2016-05-20 MED ORDER — IOPAMIDOL (ISOVUE-300) INJECTION 61%
15.0000 mL | INTRAVENOUS | Status: AC
  Administered 2016-05-20 (×2): 15 mL via ORAL

## 2016-05-20 MED ORDER — CEFOXITIN SODIUM 2 G IV SOLR
2.0000 g | Freq: Once | INTRAVENOUS | Status: AC
  Administered 2016-05-20: 2 g via INTRAVENOUS
  Filled 2016-05-20: qty 2

## 2016-05-20 MED ORDER — HYDROMORPHONE HCL 1 MG/ML IJ SOLN
1.0000 mg | Freq: Once | INTRAMUSCULAR | Status: AC
  Administered 2016-05-20: 1 mg via INTRAVENOUS
  Filled 2016-05-20: qty 1

## 2016-05-20 MED ORDER — SODIUM CHLORIDE 0.9 % IV SOLN
Freq: Once | INTRAVENOUS | Status: AC
  Administered 2016-05-20: 09:00:00 via INTRAVENOUS

## 2016-05-20 MED ORDER — ONDANSETRON HCL 4 MG/2ML IJ SOLN
4.0000 mg | Freq: Once | INTRAMUSCULAR | Status: AC
  Administered 2016-05-20: 4 mg via INTRAVENOUS
  Filled 2016-05-20: qty 2

## 2016-05-20 NOTE — ED Notes (Signed)
Patient transported to X-ray 

## 2016-05-20 NOTE — ED Notes (Signed)
Patient transported to Ultrasound 

## 2016-05-20 NOTE — ED Triage Notes (Signed)
Pt from home with epigastric pain since 0330 am. Pt states she has taken OTC meds with no relief. Describes pain as burning, sharp, constant. Pt crying during triage.

## 2016-05-20 NOTE — ED Notes (Signed)
Patient transported to CT 

## 2016-05-20 NOTE — ED Provider Notes (Signed)
[facility] Emergency Department Provider Note        Time seen: ----------------------------------------- 8:23 AM on 05/20/2016 -----------------------------------------    I have reviewed the triage vital signs and the nursing notes.   HISTORY  Chief Complaint Abdominal Pain    HPI Stephanie Good is a 46 y.o. female who presents to the ER for severe epigastric pain since 3:30 AM. Patient has tried over-the-counter medicines for GERD without any improvement. She describes the pain as burning, sharp and constant. She arrives crying and in significant pain.Patient denies a history of same, nothing makes it better or worse. She does admit to drinking some vodka with Sprite last night.   Past Medical History:  Diagnosis Date  . Anxiety   . Back pain 2013  . Benign neoplasm of breast   . Breast nodule    Fibrocystic nodule in the right breast  . Breast screening, unspecified   . Cancer [facility])    breast cancer  . Chronic back pain   . Depression   . Diverticulosis   . Endocrine problem    thyroid goiter  . Fibromyalgia 2013  . Graves disease   . Headache(784.0)   . Hemorrhoids   . Hyperlipidemia   . Hyperthyroidism   . Lump or mass in breast   . Osteoporosis 2013  . Personal history of tobacco use, presenting hazards to health   . Seasonal allergies    eye itching and swelling  . Special screening for malignant neoplasms, colon     Patient Active Problem List   Diagnosis Date Noted  . Arthritis of carpometacarpal joint 12/18/2015  . Back pain 12/18/2015  . Carpal tunnel syndrome, left 12/18/2015  . Laceration of finger with tendon involvement 12/18/2015  . Carpal tunnel syndrome, right 12/18/2015  . Acquired hypothyroidism 11/17/2015  . Chronic daily headache 11/17/2015  . Severe episode of recurrent major depressive disorder, without psychotic features ([place]) 11/17/2015  . Abnormal uterine bleeding 02/17/2015  . Pelvic prolapse  02/17/2015  . Adenomyosis 01/07/2015  . External hemorrhoids 01/07/2015  . Dysmenorrhea 01/07/2015  . Urinary incontinence 01/07/2015  . Prolapse of female pelvic organs 01/07/2015  . 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 Fibromyalgia 04/25/2012  . Multilevel degenerative disc disease 04/25/2012  . Contracture, intrinsic muscle 02/16/2012  . Brachial neuritis 12/08/2011  . Brachial neuritis or radiculitis NOS 12/08/2011  . Other symptoms referable to back 10/11/2011  . Malaise and fatigue 10/11/2011  . Tires easily 10/11/2011  . Symptoms referable to back 10/11/2011  . Vitamin D deficiency 10/11/2011  . Hypothyroidism 08/27/2011  . Hypothyroid 08/27/2011  . 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 Graves disease 08/23/2011  . Encounter for long-term (current) use of other medications 04/05/2011  . Encounter for long-term (current) use of medications 04/05/2011  . Anxiety   . Depression   . Hyperlipidemia   . Hyperthyroidism 03/08/2011    Past Surgical History:  Procedure Laterality Date  . ABDOMINAL HYSTERECTOMY    . BREAST BIOPSY  2011  . BREAST BIOPSY Right May 2014  . 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 COLONOSCOPY  1995 and 2012  . ENDOMETRIAL ABLATION  July 2010  . ESOPHAGOGASTRODUODENOSCOPY  01/14/2011  . THYROID SURGERY  2008  . TONSILLECTOMY  1995  . TUBAL LIGATION  2010    Allergies Pollen extract  Social History Social History  Substance Use Topics  . Smoking status: Former Smoker    Packs/day: 0.50  Years: 20.00    Types: Cigarettes  . Smokeless tobacco: Never Used  . Alcohol use 1.2 - 6.0 oz/week    2 - 6 Glasses of wine per week    Review of Systems Constitutional: Negative for fever. Cardiovascular: Negative for chest pain. Respiratory: Negative for shortness of breath. Gastrointestinal: Positive for abdominal pain Genitourinary: Negative for dysuria. Musculoskeletal: Negative for back pain. Skin: Negative for  rash. Neurological: Negative for headaches, focal weakness or numbness.  10-point ROS otherwise negative.  ____________________________________________   PHYSICAL EXAM:  VITAL SIGNS: ED Triage Vitals  Enc Vitals Group     BP 05/20/16 0808 (!) 151/100     Pulse Rate 05/20/16 0808 92     Resp 05/20/16 0808 20     Temp 05/20/16 0808 98.2 F (36.8 C)     Temp Source 05/20/16 0808 Oral     SpO2 05/20/16 0808 100 %     Weight 05/20/16 0808 165 lb (74.8 kg)     Height 05/20/16 0808 5\' 7"  (1.702 m)     Head Circumference --      Peak Flow --      Pain Score 05/20/16 0809 9     Pain Loc --      Pain Edu? --      Excl. in [place]? --     Constitutional: Alert and oriented. Mild to moderate distress, tearful Eyes: Conjunctivae are normal. PERRL. Normal extraocular movements. ENT   Head: Normocephalic and atraumatic.   Nose: No congestion/rhinnorhea.   Mouth/Throat: Mucous membranes are moist.   Neck: No stridor. Cardiovascular: Normal rate, regular rhythm. No murmurs, rubs, or gallops. Respiratory: Normal respiratory effort without tachypnea nor retractions. Breath sounds are clear and equal bilaterally. No wheezes/rales/rhonchi. Gastrointestinal: Epigastric tenderness, no rebound or guarding. Normal bowel sounds. Musculoskeletal: Nontender with normal range of motion in all extremities. No lower extremity tenderness nor edema. Neurologic:  Normal speech and language. No gross focal neurologic deficits are appreciated.  Skin:  Skin is warm, dry and intact. No rash noted. Psychiatric: Mood and affect are normal. Speech and behavior are normal.  ____________________________________________  EKG: Interpreted by me. Baseline artifact, likely sinus rhythm with a rate of 78 bpm, normal PR interval, normal QRS size, normal QT, nonspecific T-wave abnormality  ____________________________________________  ED COURSE:  Pertinent labs & imaging results that were available during  my care of the patient were reviewed by me and considered in my medical decision making (see chart for details).  patient presents to ER with epigastric pain. We will assess with labs and imaging. Suspect GERD, pancreatitis or peptic ulcer disease  Procedures ____________________________________________   LABS (pertinent positives/negatives)  Labs Reviewed  CBC WITH DIFFERENTIAL/PLATELET - Abnormal; Notable for the following:       Result Value   WBC 11.1 (*)    Neutro Abs 8.0 (*)    All other components within normal limits  COMPREHENSIVE METABOLIC PANEL - Abnormal; Notable for the following:    Total Bilirubin 0.2 (*)    All other components within normal limits  URINALYSIS, COMPLETE (UACMP) WITH MICROSCOPIC - Abnormal; Notable for the following:    Color, Urine STRAW (*)    APPearance CLEAR (*)    Bacteria, UA RARE (*)    Squamous Epithelial / LPF 0-5 (*)    All other components within normal limits  LIPASE, BLOOD  ETHANOL  POC URINE PREG, ED  POCT PREGNANCY, URINE    RADIOLOGY Images were viewed by me  Abdomen 2  view  IMPRESSION: No evident bowel obstruction or free air. Moderate stool throughout Colon. IMPRESSION: 1. Possible early acute cholecystitis with evidence of mild gallbladder wall thickening and subtle adjacent pericholecystic inflammation. Consider follow-up right upper quadrant ultrasound for further assessment. 2. No other evidence of an acute abnormality. IMPRESSION: 1. Stones and sludge are seen in the gallbladder with mild wall thickening and trace pericholecystic fluid but no Murphy's sign. Acute cholecystitis is not excluded in the appropriate clinical setting. A HIDA scan could further evaluate if clinically warranted.  ____________________________________________  FINAL ASSESSMENT AND PLAN  Abdominal pain, Biliary colic  Plan: Patient with labs and imaging as dictated above. Patient had presented to the ER with severe upper abdominal  pain that was nonfocal. CT scan and ultrasound are concerning for cholecystitis. I will discuss with general surgery for evaluation.   Earleen Newport, MD   Note: This note was generated in part or whole with voice recognition software. Voice recognition is usually quite accurate but there are transcription errors that can and very often do occur. I apologize for any typographical errors that were not detected and corrected.     Earleen Newport, MD 05/20/16 [phone]
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
## 2016-05-20 NOTE — Discharge Instructions (Signed)
Please proceed directly to the [facility] emergency department for further evaluation per general surgery etc.

## 2016-06-16 ENCOUNTER — Other Ambulatory Visit: Payer: Self-pay | Admitting: Neurology

## 2016-06-16 DIAGNOSIS — M542 Cervicalgia: Secondary | ICD-10-CM

## 2016-06-18 ENCOUNTER — Ambulatory Visit: Admission: RE | Admit: 2016-06-18 | Payer: 59 | Source: Ambulatory Visit

## 2016-11-30 ENCOUNTER — Encounter: Payer: Self-pay | Admitting: Podiatry

## 2016-11-30 ENCOUNTER — Ambulatory Visit (INDEPENDENT_AMBULATORY_CARE_PROVIDER_SITE_OTHER): Payer: BLUE CROSS/BLUE SHIELD

## 2016-11-30 ENCOUNTER — Ambulatory Visit (INDEPENDENT_AMBULATORY_CARE_PROVIDER_SITE_OTHER): Payer: BLUE CROSS/BLUE SHIELD | Admitting: Podiatry

## 2016-11-30 DIAGNOSIS — M19079 Primary osteoarthritis, unspecified ankle and foot: Secondary | ICD-10-CM

## 2016-11-30 DIAGNOSIS — M7670 Peroneal tendinitis, unspecified leg: Secondary | ICD-10-CM | POA: Diagnosis not present

## 2016-11-30 DIAGNOSIS — M7672 Peroneal tendinitis, left leg: Secondary | ICD-10-CM | POA: Diagnosis not present

## 2016-11-30 DIAGNOSIS — M79673 Pain in unspecified foot: Secondary | ICD-10-CM

## 2016-11-30 MED ORDER — BETAMETHASONE SOD PHOS & ACET 6 (3-3) MG/ML IJ SUSP: 3.0000 mg | Freq: Once | INTRAMUSCULAR | Status: AC

## 2016-11-30 NOTE — Progress Notes (Signed)
   HPI: 46 year old female presents to the office today for bilateral arch and foot pain. Patient states that she's been seen in the past by other podiatrists that given her injections which only helped temporarily. Patient states she constantly has pain and also suffers from fibromyalgia and spinal pain. Patient presents today with her husband for further treatment and evaluation   Physical Exam: General: The patient is alert and oriented x3 in no acute distress.  Dermatology: Skin is warm, dry and supple bilateral lower extremities. Negative for open lesions or macerations.  Vascular: Palpable pedal pulses bilaterally. No edema or erythema noted. Capillary refill within normal limits.  Neurological: Epicritic and protective threshold grossly intact bilaterally.   Musculoskeletal Exam: Pain on palpation noted to the peroneal tendon sheath of the bilateral lower extremities also at the insertion of the fifth metatarsal tubercle. There is also pain on palpation and range of motion to the talonavicular joint bilateral. Range of motion within normal limits to all pedal and ankle joints bilateral. Muscle strength 5/5 in all groups bilateral.   Radiographic Exam:  Normal osseous mineralization.   there is some mild joint space narrowing with DJD noted to the talonavicular joints bilateral  Assessment: 1.  Insertional peroneal tendinitis bilateral 2. Talonavicular DJD bilateral   Plan of Care:  1. Patient was evaluated. today x-rays were reviewed 2.  Injection of 0.5 mL Celestone Soluspan injected into the peroneal tendon sheath left lower extremity 3. Ankle braces were dispensed bilateral to support all pedal and ankle structures of the foot 4. Prescription for Medrol Dosepak 5. Patient currently takes multiple medications for anti-inflammatories and pain management 6. Return to clinic in 4 weeks  Patient also has ingrown toenails. We will address those next visit  Patient is going  camping in [place] this weekend   Edrick Kins, DPM [facility]  Dr. Edrick Kins, DPM    [address], [place] [address]                Office [phone]  Fax [phone]

## 2016-12-29 ENCOUNTER — Ambulatory Visit (INDEPENDENT_AMBULATORY_CARE_PROVIDER_SITE_OTHER): Payer: BLUE CROSS/BLUE SHIELD | Admitting: Orthotics

## 2016-12-29 DIAGNOSIS — M7670 Peroneal tendinitis, unspecified leg: Secondary | ICD-10-CM | POA: Diagnosis not present

## 2016-12-29 DIAGNOSIS — M19079 Primary osteoarthritis, unspecified ankle and foot: Secondary | ICD-10-CM

## 2016-12-29 NOTE — Progress Notes (Signed)
Patient prsents today with peroneal tendonitis bilat, but more pronounced on left.  Upon palpation, tenderness/discomfort at insertion at cuboind/5 met base.   Plan on semi-rigid CMFO with offload 5th met head and 5th ray cut out, 4* valgus RF post.

## 2016-12-31 ENCOUNTER — Ambulatory Visit: Payer: BLUE CROSS/BLUE SHIELD | Admitting: Podiatry

## 2017-01-11 ENCOUNTER — Ambulatory Visit: Payer: BLUE CROSS/BLUE SHIELD | Admitting: Podiatry

## 2017-01-26 ENCOUNTER — Encounter: Payer: BLUE CROSS/BLUE SHIELD | Admitting: Orthotics

## 2017-02-16 ENCOUNTER — Encounter: Payer: BLUE CROSS/BLUE SHIELD | Admitting: Orthotics

## 2017-03-08 ENCOUNTER — Other Ambulatory Visit: Payer: Self-pay | Admitting: Podiatry

## 2017-03-08 DIAGNOSIS — M7672 Peroneal tendinitis, left leg: Secondary | ICD-10-CM

## 2017-04-08 ENCOUNTER — Telehealth: Payer: Self-pay | Admitting: Podiatry

## 2017-04-08 NOTE — Telephone Encounter (Signed)
Pt left a voicemail @ 814am today regarding her orthotics. She said she has met her deductible and would like to get her orthotics. Please call pt she is a [place] pt.

## 2017-04-19 ENCOUNTER — Ambulatory Visit: Payer: BLUE CROSS/BLUE SHIELD | Admitting: Orthotics

## 2017-04-19 DIAGNOSIS — M7670 Peroneal tendinitis, unspecified leg: Secondary | ICD-10-CM

## 2017-04-19 DIAGNOSIS — M19079 Primary osteoarthritis, unspecified ankle and foot: Secondary | ICD-10-CM

## 2017-04-19 NOTE — Progress Notes (Signed)
Patient came in today to pick up custom made foot orthotics.  The goals were accomplished and the patient reported no dissatisfaction with said orthotics.  Patient was advised of breakin period and how to report any issues. 

## 2018-10-21 IMAGING — US US ABDOMEN LIMITED
1 series · 14 of 25 positions shown · non-contrast
Comparison: CT scan earlier today

CLINICAL DATA: Right upper quadrant pain.

EXAM:
US ABDOMEN LIMITED - RIGHT UPPER QUADRANT

[Series 1: us abdomen limited · 0.25mm/px · 14 of 41 slices shown]
[im 1/41]
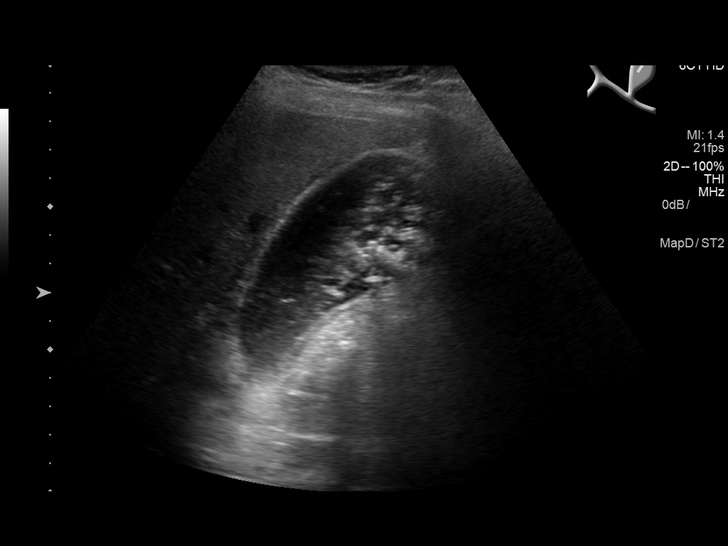
[im 4/41]
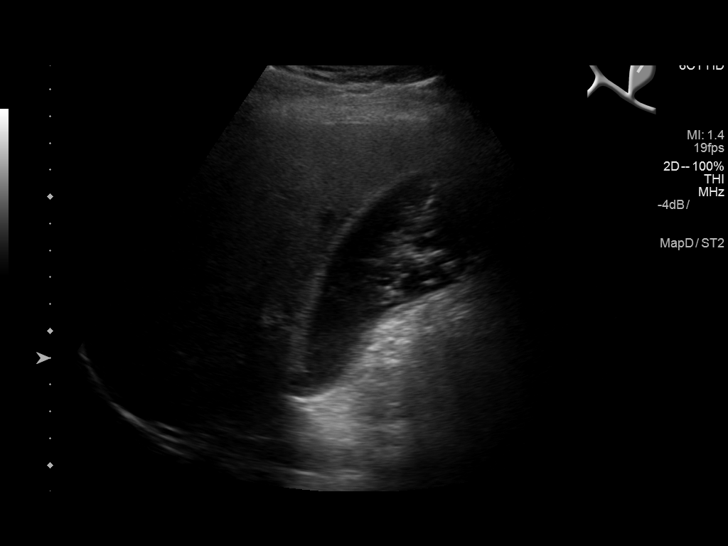
[im 7/41]
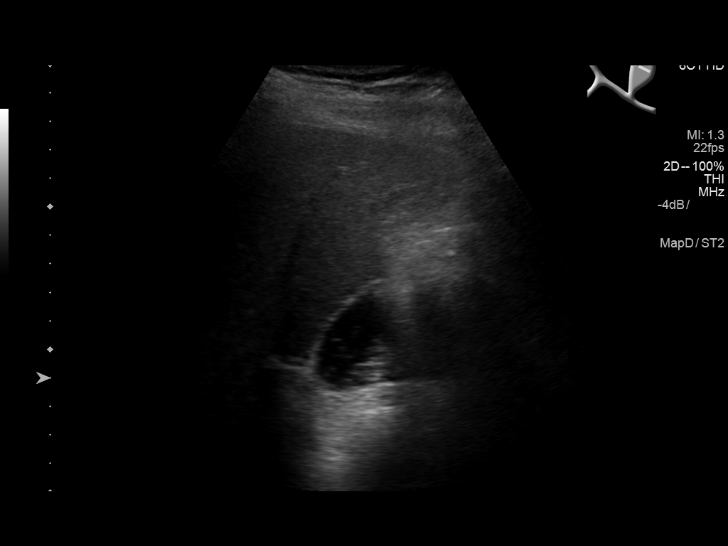
[im 11/41]
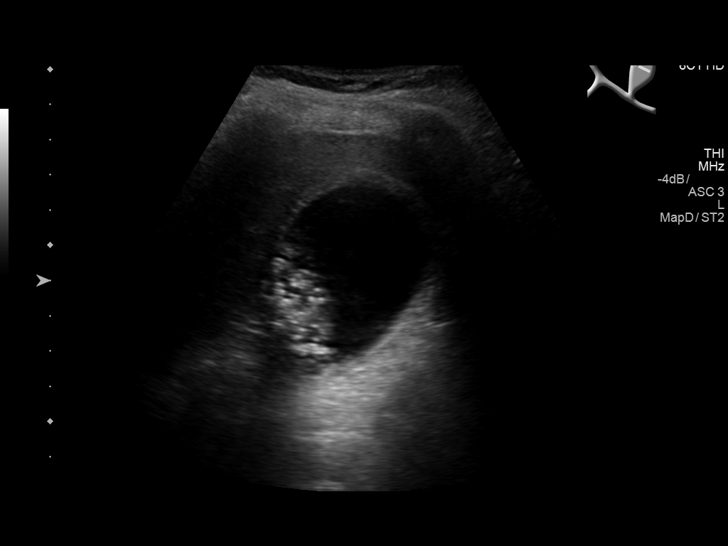
[im 14/41]
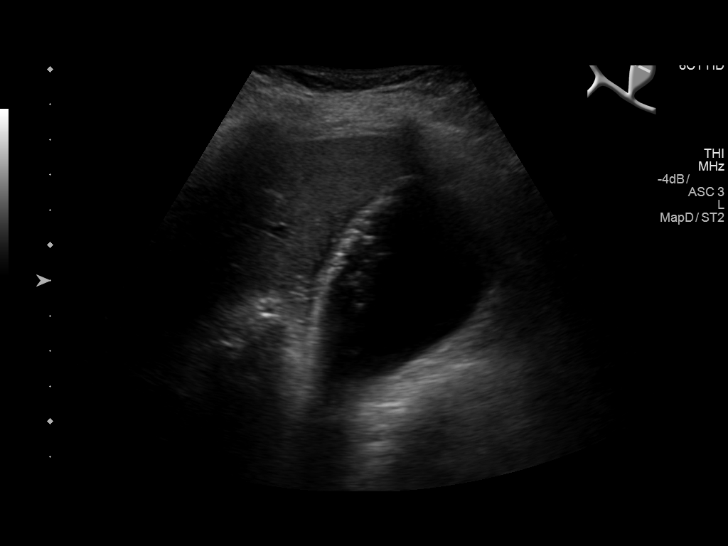
[im 16/41]
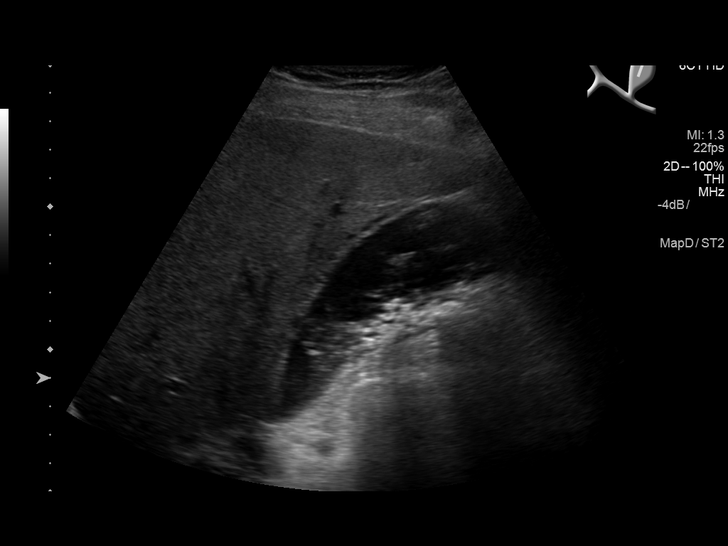
[im 19/41]
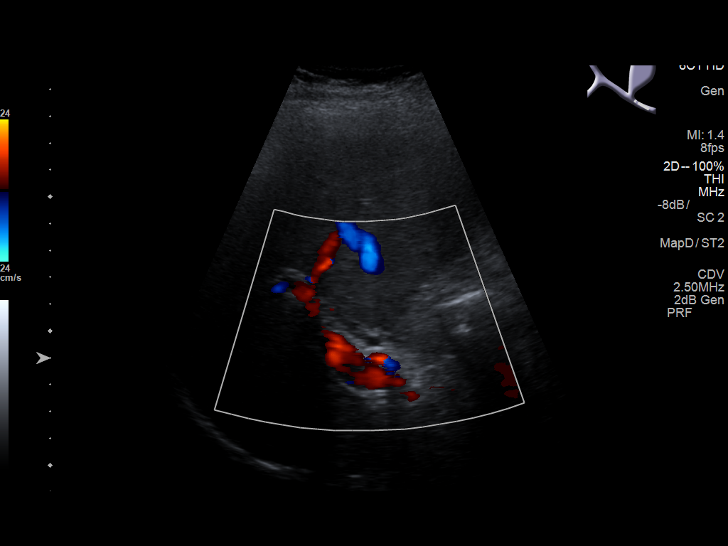
[im 22/41]
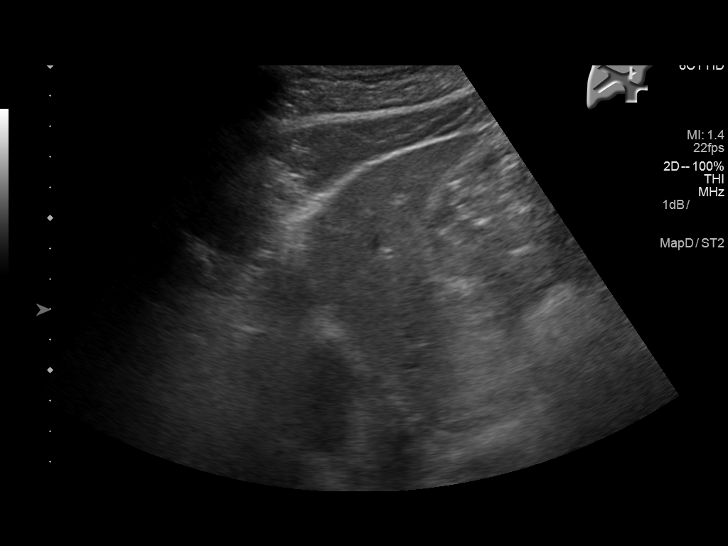
[im 26/41]
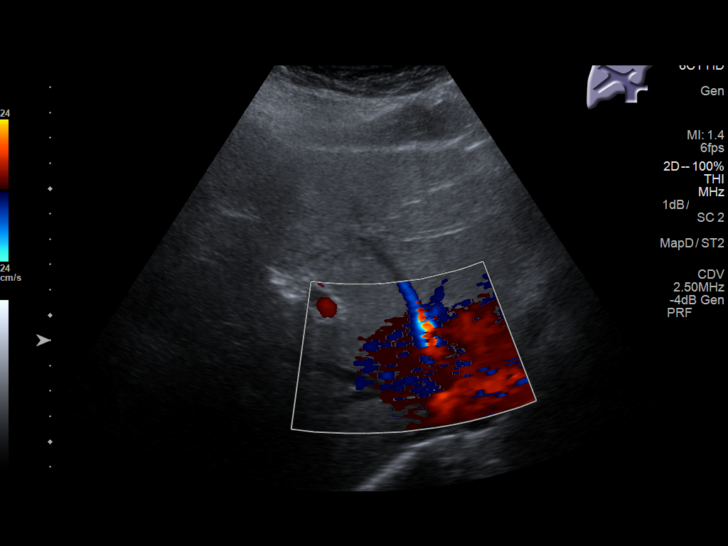
[im 27/41]
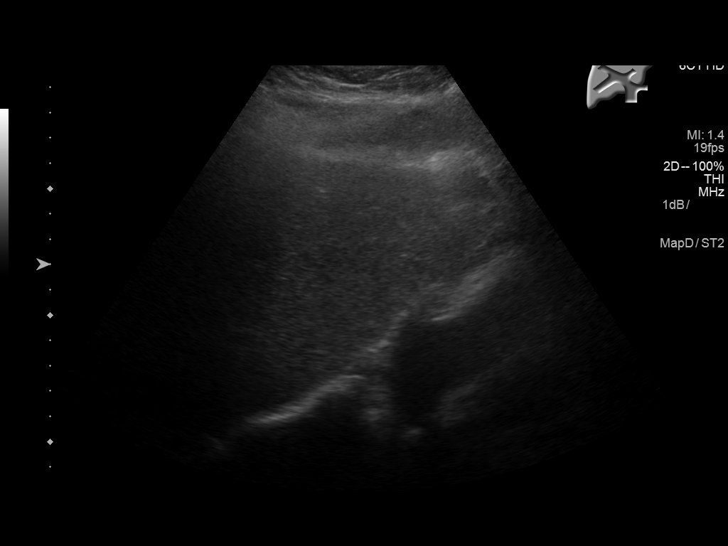
[im 31/41]
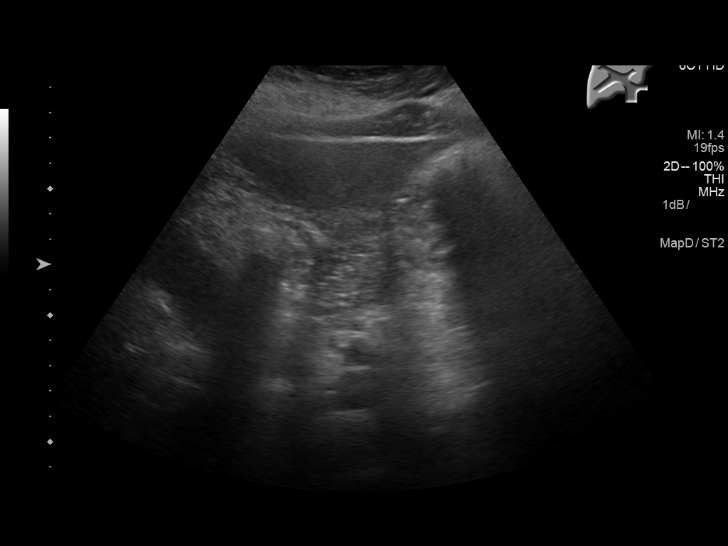
[im 34/41]
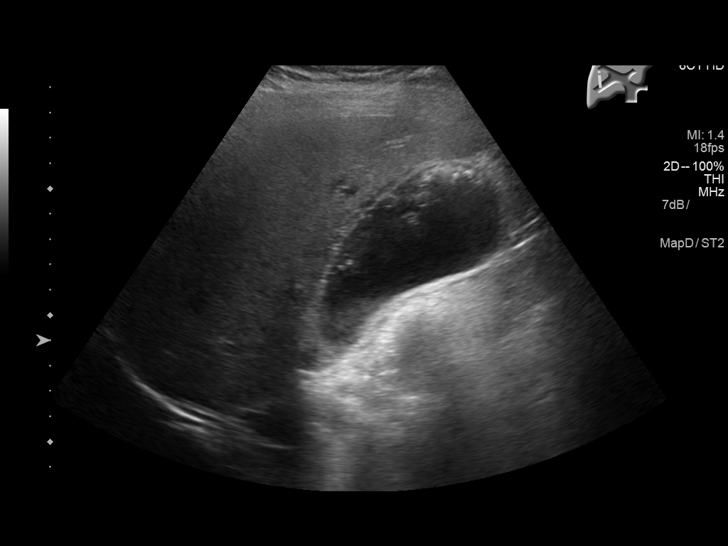
[im 37/41]
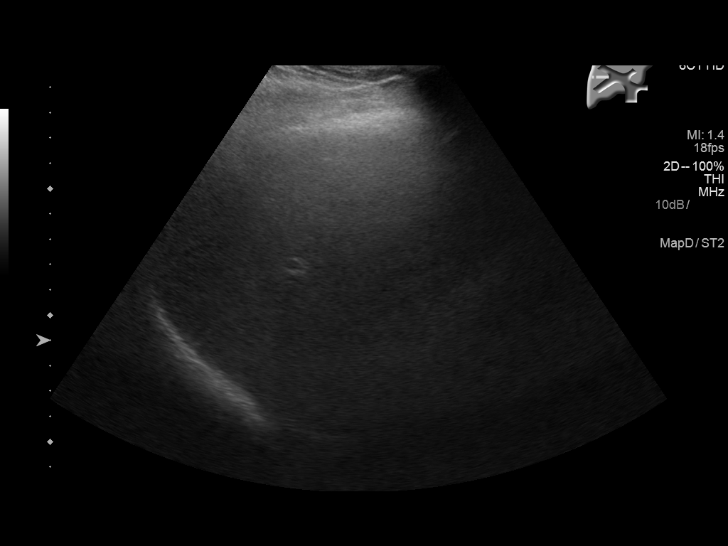
[im 41/41]
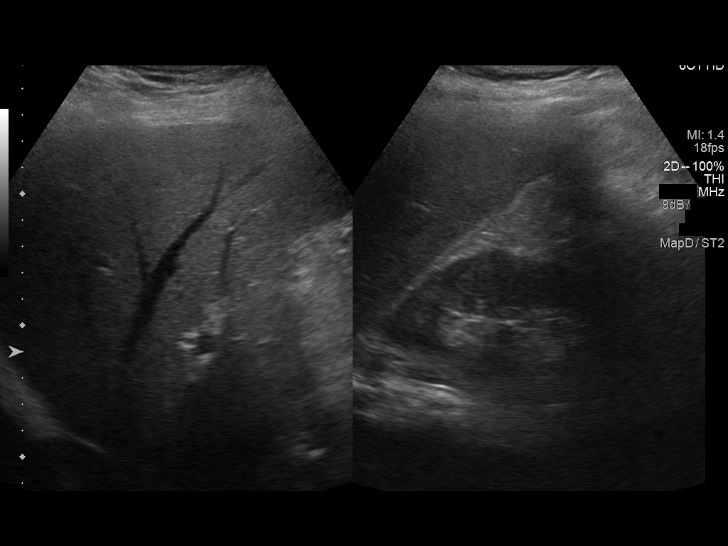

[14 of 25 positions shown; findings below may reference images not displayed]

FINDINGS: Gallbladder:

Stones and sludge are seen in the gallbladder with trace
pericholecystic fluid and slight wall thickening, measuring 4 mm. No
Murphy's sign reported.

Common bile duct:

Diameter: 4 mm

Liver:

No focal lesion identified. Within normal limits in parenchymal
echogenicity.
IMPRESSION: 1. Stones and sludge are seen in the gallbladder with mild wall
thickening and trace pericholecystic fluid but no Murphy's sign.
Acute cholecystitis is not excluded in the appropriate clinical
setting. A HIDA scan could further evaluate if clinically warranted.
